# Patient Record
Sex: Female | Born: 1990 | Race: Black or African American | Hispanic: No | Marital: Single | Smoking: Never smoker
Health system: Southern US, Community
[De-identification: ages and names within clinical notes are randomized; demographics above are authoritative.]

## PROBLEM LIST (undated history)

## (undated) DIAGNOSIS — G40909 Epilepsy, unspecified, not intractable, without status epilepticus: Secondary | ICD-10-CM

## (undated) DIAGNOSIS — F329 Major depressive disorder, single episode, unspecified: Secondary | ICD-10-CM

## (undated) DIAGNOSIS — G43909 Migraine, unspecified, not intractable, without status migrainosus: Secondary | ICD-10-CM

## (undated) DIAGNOSIS — F32A Depression, unspecified: Secondary | ICD-10-CM

## (undated) DIAGNOSIS — F419 Anxiety disorder, unspecified: Secondary | ICD-10-CM

## (undated) HISTORY — DX: Migraine, unspecified, not intractable, without status migrainosus: G43.909

## (undated) HISTORY — PX: WISDOM TOOTH EXTRACTION: SHX21

---
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
## 2011-07-14 ENCOUNTER — Inpatient Hospital Stay (INDEPENDENT_AMBULATORY_CARE_PROVIDER_SITE_OTHER)
Admission: RE | Admit: 2011-07-14 | Discharge: 2011-07-14 | Disposition: A | Discharge status: Home / Self Care | Payer: PRIVATE HEALTH INSURANCE | Source: Ambulatory Visit | Attending: Family Medicine | Admitting: Family Medicine

## 2011-07-14 ENCOUNTER — Emergency Department (HOSPITAL_COMMUNITY)
Admission: EM | Admit: 2011-07-14 | Discharge: 2011-07-15 | Disposition: A | Discharge status: Home / Self Care | Payer: PRIVATE HEALTH INSURANCE | Attending: Emergency Medicine | Admitting: Emergency Medicine

## 2011-07-14 DIAGNOSIS — M6281 Muscle weakness (generalized): Secondary | ICD-10-CM

## 2011-07-14 DIAGNOSIS — M79609 Pain in unspecified limb: Secondary | ICD-10-CM | POA: Insufficient documentation

## 2011-07-14 DIAGNOSIS — Z79899 Other long term (current) drug therapy: Secondary | ICD-10-CM | POA: Insufficient documentation

## 2011-07-14 DIAGNOSIS — G40909 Epilepsy, unspecified, not intractable, without status epilepticus: Secondary | ICD-10-CM | POA: Insufficient documentation

## 2011-07-14 DIAGNOSIS — M549 Dorsalgia, unspecified: Secondary | ICD-10-CM | POA: Insufficient documentation

## 2011-07-14 DIAGNOSIS — R52 Pain, unspecified: Secondary | ICD-10-CM | POA: Insufficient documentation

## 2011-07-14 DIAGNOSIS — F39 Unspecified mood [affective] disorder: Secondary | ICD-10-CM

## 2011-07-14 LAB — POCT I-STAT, CHEM 8
BUN: 7 mg/dL (ref 6–23)
Calcium, Ion: 1.16 mmol/L (ref 1.12–1.32)
Chloride: 101 mEq/L (ref 96–112)
Creatinine, Ser: 1 mg/dL (ref 0.50–1.10)
Glucose, Bld: 89 mg/dL (ref 70–99)
HCT: 39 % (ref 36.0–46.0)
Hemoglobin: 13.3 g/dL (ref 12.0–15.0)
TCO2: 20 mmol/L (ref 0–100)

## 2011-11-17 ENCOUNTER — Emergency Department (HOSPITAL_COMMUNITY)
Admission: EM | Admit: 2011-11-17 | Discharge: 2011-11-17 | Disposition: A | Discharge status: Home / Self Care | Payer: PRIVATE HEALTH INSURANCE | Attending: Emergency Medicine | Admitting: Emergency Medicine

## 2011-11-17 ENCOUNTER — Encounter: Payer: Self-pay | Admitting: *Deleted

## 2011-11-17 DIAGNOSIS — Z79899 Other long term (current) drug therapy: Secondary | ICD-10-CM | POA: Insufficient documentation

## 2011-11-17 DIAGNOSIS — F3289 Other specified depressive episodes: Secondary | ICD-10-CM | POA: Insufficient documentation

## 2011-11-17 DIAGNOSIS — F329 Major depressive disorder, single episode, unspecified: Secondary | ICD-10-CM | POA: Insufficient documentation

## 2011-11-17 DIAGNOSIS — S0100XA Unspecified open wound of scalp, initial encounter: Secondary | ICD-10-CM | POA: Insufficient documentation

## 2011-11-17 DIAGNOSIS — IMO0002 Reserved for concepts with insufficient information to code with codable children: Secondary | ICD-10-CM | POA: Insufficient documentation

## 2011-11-17 DIAGNOSIS — S0101XA Laceration without foreign body of scalp, initial encounter: Secondary | ICD-10-CM

## 2011-11-17 DIAGNOSIS — R51 Headache: Secondary | ICD-10-CM | POA: Insufficient documentation

## 2011-11-17 HISTORY — DX: Depression, unspecified: F32.A

## 2011-11-17 HISTORY — DX: Major depressive disorder, single episode, unspecified: F32.9

## 2011-11-17 MED ORDER — HYDROCODONE-ACETAMINOPHEN 5-325 MG PO TABS
1.0000 | ORAL_TABLET | Freq: Once | ORAL | Status: AC
  Administered 2011-11-17: 1 via ORAL
  Filled 2011-11-17: qty 1

## 2011-11-17 MED ORDER — TETANUS-DIPHTH-ACELL PERTUSSIS 5-2.5-18.5 LF-MCG/0.5 IM SUSP
0.5000 mL | Freq: Once | INTRAMUSCULAR | Status: AC
  Administered 2011-11-17: 0.5 mL via INTRAMUSCULAR
  Filled 2011-11-17 (×2): qty 0.5

## 2011-11-17 MED ORDER — HYDROCODONE-ACETAMINOPHEN 5-325 MG PO TABS: 1.0000 | ORAL_TABLET | Freq: Four times a day (QID) | ORAL | Status: AC | PRN

## 2011-11-17 NOTE — ED Provider Notes (Signed)
History     CSN: [id number]  Arrival date & time 11/17/11  1715   First MD Initiated Contact with Patient 11/17/11 2024      Chief Complaint  Patient presents with  . Head Laceration    (Consider location/radiation/quality/duration/timing/severity/associated sxs/prior treatment) HPI Comments: Patient presents to the emergency department with a chief complaint of scalp laceration.  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  Her head with contact to the automatic can try her.  Patient does not know when her last tetanus shot was.  Patient denies headaches, change in vision, nausea, vomiting.  Patient is a 21 y.o. female presenting with scalp laceration. The history is provided by the patient.  Head Laceration This is a new problem. The current episode started today. Pertinent negatives include no abdominal pain, anorexia, arthralgias, change in bowel habit, chest pain, chills, congestion, coughing, diaphoresis, fatigue, fever, headaches, joint swelling, myalgias, nausea, neck pain, numbness, rash, sore throat, urinary symptoms, vertigo, visual change, vomiting or weakness. The symptoms are aggravated by nothing. She has tried nothing for the symptoms.    Past Medical History  Diagnosis Date  . Depression     Past Surgical History  Procedure Date  . Wisdom tooth extraction     Family History  Problem Relation Age of Onset  . Diabetes Mother   . Hypertension Father     History  Substance Use Topics  . Smoking status: Never Smoker   . Smokeless tobacco: Never Used  . Alcohol Use: No    OB History    Grav Para Term Preterm Abortions TAB SAB Ect Mult Living                  Review of Systems  Constitutional: Negative for fever, chills, diaphoresis and fatigue.  HENT: Negative for congestion, sore throat and neck pain.   Respiratory: Negative for cough.   Cardiovascular: Negative for chest pain.  Gastrointestinal: Negative for nausea, vomiting,  abdominal pain, anorexia and change in bowel habit.  Musculoskeletal: Negative for myalgias, joint swelling and arthralgias.  Skin: Negative for rash.  Neurological: Negative for vertigo, weakness, numbness and headaches.  All other systems reviewed and are negative.    Allergies  Review of patient's allergies indicates no known allergies.  Home Medications   Current Outpatient Rx  Name Route Sig Dispense Refill  . CITALOPRAM HYDROBROMIDE 20 MG PO TABS Oral Take 20 mg by mouth daily.      Marland Kitchen FERROUS SULFATE 325 (65 FE) MG PO TABS Oral Take 325 mg by mouth daily with breakfast.      . POTASSIUM GLUCONATE 2 MEQ PO TABS Oral Take 1 tablet by mouth.        BP 135/61  Pulse 71  Temp(Src) 98.7 F (37.1 C) (Oral)  Resp 18  SpO2 97%  LMP 11/09/2011  Physical Exam  Nursing note and vitals reviewed. Constitutional: She is oriented to person, place, and time. She appears well-developed and well-nourished. No distress.  HENT:  Head: Normocephalic. Head is without raccoon's eyes and without Battle's sign.         4 cm scalp laceration currently bleeding.  Tender to palpation.  Eyes: Conjunctivae and EOM are normal. Pupils are equal, round, and reactive to light.       Normal appearance  Neck: Normal range of motion.  Musculoskeletal: Normal range of motion.  Neurological: She is alert and oriented to person, place, and time. Coordination normal.  Psychiatric: She has  a normal mood and affect. Her behavior is normal.    ED Course  Procedures (including critical care time) LACERATION REPAIR Performed by: Jaci Carrel Authorized by: Jaci Carrel Consent: Verbal consent obtained. Risks and benefits: risks, benefits and alternatives were discussed Consent given by: patient Patient identity confirmed: provided demographic data Prepped and Draped in normal sterile fashion Wound explored  Laceration Location: Scalp laceration  Laceration Length: For cm  No Foreign Bodies seen or  palpated  Anesthesia: local infiltration  Local anesthetic: lidocaine not used   Irrigation method: syringe Amount of cleaning: standard  Skin closure: Staples   Number of staples: Four   Patient tolerance: Patient tolerated the procedure well with no immediate complications.   Labs Reviewed - No data to display No results found.   No diagnosis found.  Patient hemodynamically stable no acute complaints.  She is being discharged with pain medication and instructions to return to the emergency department for staple removal and wound check.  MDM  Scalp laceration        Jaci Carrel, [place] 11/17/11 2118

## 2011-11-17 NOTE — ED Notes (Signed)
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

## 2011-11-18 NOTE — ED Provider Notes (Signed)
Medical screening examination/treatment/procedure(s) were performed by non-physician practitioner and as supervising physician I was immediately available for consultation/collaboration.  Avion Patella P Teyanna Thielman, MD 11/18/11 0025 

## 2012-07-15 ENCOUNTER — Emergency Department (HOSPITAL_COMMUNITY)
Admission: EM | Admit: 2012-07-15 | Discharge: 2012-07-17 | Disposition: A | Discharge status: Other Acute Inpatient Hospital | Payer: No Typology Code available for payment source | Attending: Emergency Medicine | Admitting: Emergency Medicine

## 2012-07-15 ENCOUNTER — Encounter (HOSPITAL_COMMUNITY): Payer: Self-pay | Admitting: Emergency Medicine

## 2012-07-15 DIAGNOSIS — F329 Major depressive disorder, single episode, unspecified: Secondary | ICD-10-CM

## 2012-07-15 DIAGNOSIS — IMO0002 Reserved for concepts with insufficient information to code with codable children: Secondary | ICD-10-CM | POA: Insufficient documentation

## 2012-07-15 DIAGNOSIS — F32A Depression, unspecified: Secondary | ICD-10-CM

## 2012-07-15 DIAGNOSIS — F3289 Other specified depressive episodes: Secondary | ICD-10-CM | POA: Insufficient documentation

## 2012-07-15 DIAGNOSIS — Z79899 Other long term (current) drug therapy: Secondary | ICD-10-CM | POA: Insufficient documentation

## 2012-07-15 DIAGNOSIS — R45851 Suicidal ideations: Secondary | ICD-10-CM | POA: Insufficient documentation

## 2012-07-15 HISTORY — DX: Epilepsy, unspecified, not intractable, without status epilepticus: G40.909

## 2012-07-15 LAB — CBC
HCT: 36.3 % (ref 36.0–46.0)
Hemoglobin: 12.2 g/dL (ref 12.0–15.0)
MCH: 26.2 pg (ref 26.0–34.0)
MCHC: 33.6 g/dL (ref 30.0–36.0)
MCV: 77.9 fL — ABNORMAL LOW (ref 78.0–100.0)
Platelets: 231 10*3/uL (ref 150–400)
RBC: 4.66 MIL/uL (ref 3.87–5.11)
RDW: 13.2 % (ref 11.5–15.5)
WBC: 6.2 10*3/uL (ref 4.0–10.5)

## 2012-07-15 LAB — COMPREHENSIVE METABOLIC PANEL
ALT: 10 U/L (ref 0–35)
AST: 15 U/L (ref 0–37)
Albumin: 4.2 g/dL (ref 3.5–5.2)
Alkaline Phosphatase: 47 U/L (ref 39–117)
BUN: 9 mg/dL (ref 6–23)
CO2: 29 mEq/L (ref 19–32)
Calcium: 9.4 mg/dL (ref 8.4–10.5)
Chloride: 102 mEq/L (ref 96–112)
Creatinine, Ser: 0.81 mg/dL (ref 0.50–1.10)
GFR calc Af Amer: >90 mL/min (ref >90)
GFR calc non Af Amer: >90 mL/min (ref >90)
Glucose, Bld: 106 mg/dL — ABNORMAL HIGH (ref 70–99)
Potassium: 3.6 mEq/L (ref 3.5–5.1)
Sodium: 138 mEq/L (ref 135–145)
Total Bilirubin: 0.9 mg/dL (ref 0.3–1.2)
Total Protein: 7.5 g/dL (ref 6.0–8.3)

## 2012-07-15 LAB — RAPID URINE DRUG SCREEN, HOSP PERFORMED
Amphetamines: NOT DETECTED
Barbiturates: NOT DETECTED
Benzodiazepines: NOT DETECTED
Cocaine: NOT DETECTED
Opiates: NOT DETECTED
Tetrahydrocannabinol: NOT DETECTED

## 2012-07-15 LAB — PREGNANCY, URINE: Preg Test, Ur: NEGATIVE

## 2012-07-15 LAB — ETHANOL: Alcohol, Ethyl (B): <11 mg/dL (ref 0–11)

## 2012-07-15 MED ORDER — CITALOPRAM HYDROBROMIDE 20 MG PO TABS
20.0000 mg | ORAL_TABLET | Freq: Every day | ORAL | Status: DC
  Administered 2012-07-15: 20 mg via ORAL
  Filled 2012-07-15 (×2): qty 1

## 2012-07-15 NOTE — ED Notes (Signed)
Pt given sandwhich and water to drink.

## 2012-07-15 NOTE — ED Provider Notes (Signed)
History     CSN: [id number]  Arrival date & time 07/15/12  1910   First MD Initiated Contact with Patient 07/15/12 2107      Chief Complaint  Patient presents with  . Medical Clearance    (Consider location/radiation/quality/duration/timing/severity/associated sxs/prior treatment) The history is provided by the patient. No language interpreter was used.  cc:  21 year-old female coming in today with suicidal ideation and depression. States that she is in school and working and living with her parents who she does not get along with especially her mother. 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 States that the scar since healed over since then. Today she's been thinking of driving her car off the side of the road. 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 States that she feels that she is about to have a mental rate down. She does go to a therapist and her therapist told her to come to the ER. States that the only trouble she can think that she gets her parents is not calling when she didn't come home. Denies drugs or alcohol she does not smoke. Patient is on Celexa x 1 year 20 mg a day. Patient's friend and aunt at the bedside   Past Medical History  Diagnosis Date  . Depression   . Epilepsy     Past Surgical History  Procedure Date  . Wisdom tooth extraction     Family History  Problem Relation Age of Onset  . Diabetes Mother   . Hypertension Father     History  Substance Use Topics  . Smoking status: Never Smoker   . Smokeless tobacco: Never Used  . Alcohol Use: No    OB History    Grav Para Term Preterm Abortions TAB SAB Ect Mult Living                  Review of Systems  Constitutional: Negative.   HENT: Negative.   Eyes: Negative.   Respiratory: Negative.   Cardiovascular: Negative.   Gastrointestinal: Negative.   Neurological: Negative.     Psychiatric/Behavioral: Positive for agitation.       Depressed /suicidal  All other systems reviewed and are negative.    Allergies  Review of patient's allergies indicates no known allergies.  Home Medications   Current Outpatient Rx  Name Route Sig Dispense Refill  . CITALOPRAM HYDROBROMIDE 20 MG PO TABS Oral Take 20 mg by mouth daily.     Marland Kitchen FERROUS SULFATE 325 (65 FE) MG PO TABS Oral Take 325 mg by mouth daily with breakfast.       BP 120/79  Pulse 89  Temp 98 F (36.7 C)  Resp 20  SpO2 99%  LMP 07/05/2012  Physical Exam  Nursing note and vitals reviewed. Constitutional: She is oriented to person, place, and time. She appears well-developed and well-nourished.  HENT:  Head: Normocephalic and atraumatic.  Eyes: Conjunctivae and EOM are normal. Pupils are equal, round, and reactive to light.  Neck: Normal range of motion. Neck supple.  Cardiovascular: Normal rate.   Pulmonary/Chest: Effort normal.  Abdominal: Soft.  Musculoskeletal: Normal range of motion. She exhibits no edema and no tenderness.  Neurological: She is alert and oriented to person, place, and time. She has normal reflexes. No cranial nerve deficit. Coordination normal.  Skin: Skin is warm and dry.  Psychiatric: Her speech is normal. Judgment normal. Her mood  appears anxious. She is slowed. Cognition and memory are normal. She exhibits a depressed mood. She expresses suicidal ideation. She expresses no homicidal ideation. She expresses suicidal plans. She expresses no homicidal plans.    ED Course  Procedures (including critical care time)  Labs Reviewed  CBC - Abnormal; Notable for the following:    MCV 77.9 (*)     All other components within normal limits  COMPREHENSIVE METABOLIC PANEL - Abnormal; Notable for the following:    Glucose, Bld 106 (*)     All other components within normal limits  ETHANOL  URINE RAPID DRUG SCREEN (HOSP PERFORMED)  PREGNANCY, URINE   No results found.   No  diagnosis found.    MDM  21 year old coming in with depression and suicidal ideations. Lives with her parents and is not getting along with him especially her mother. Wants to drive her car off the side of the road to  End it all. Psyche holding orders in. Med orders in. Sitter at bedside.  For telepsyche evaluation.  On celexa 20mg  daily.  Works at [employer] and goes to [employer].    Labs Reviewed  CBC - Abnormal; Notable for the following:    MCV 77.9 (*)     All other components within normal limits  COMPREHENSIVE METABOLIC PANEL - Abnormal; Notable for the following:    Glucose, Bld 106 (*)     All other components within normal limits  ETHANOL  URINE RAPID DRUG SCREEN (HOSP PERFORMED)  PREGNANCY, URINE          Remi Haggard, NP 07/16/12 0120

## 2012-07-15 NOTE — ED Notes (Signed)
MD at bedside. 

## 2012-07-15 NOTE — ED Notes (Signed)
Pt states she has depression and has been seeing a therapist for that  Pt states she had an appt with her today and it went well   Pt states after her appt she talked with her grandmother that was giving her a hard time about school and her friends  Pt states she has been having issues with school, her family, etc and today it just became too much  Pt states she thought about getting in her car and just driving and whatever happened would happen  Pt states today is the first time in a long time she has had suicidal thoughts, no plan and states she thinks about hurting others almost daily   Visitor in room with pt very supportive of pt and her situation   Pt belongings taken to the car by her aunt.

## 2012-07-16 MED ORDER — ACETAMINOPHEN 325 MG PO TABS
650.0000 mg | ORAL_TABLET | Freq: Four times a day (QID) | ORAL | Status: DC | PRN
  Administered 2012-07-16: 650 mg via ORAL
  Filled 2012-07-16: qty 2

## 2012-07-16 MED ORDER — CITALOPRAM HYDROBROMIDE 20 MG PO TABS
30.0000 mg | ORAL_TABLET | Freq: Every day | ORAL | Status: DC
  Administered 2012-07-16 – 2012-07-17 (×2): 30 mg via ORAL
  Filled 2012-07-16 (×2): qty 1

## 2012-07-16 NOTE — BHH Counselor (Signed)
Per Venda Rodes at [facility] - pt has been accepted by Donell Sievert PA pending 500 hall bed.

## 2012-07-16 NOTE — ED Notes (Signed)
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
## 2012-07-16 NOTE — ED Provider Notes (Signed)
No issues overnight. Medically stable. Awake and no issues. Pending placement in behavorial health.   Richardean Canal, MD 07/16/12 [phone]

## 2012-07-16 NOTE — ED Provider Notes (Signed)
Psychiatry consult completed. Recommending inpatient treatment. Celexa increased per recommendation.   Raeford Razor, MD 07/16/12 [phone]

## 2012-07-16 NOTE — BHH Counselor (Signed)
Info sent to the following locations today:   [facility] - Pending review  [facility] - Pending reveiw [facility] - no bed availability  [facility] - no bed availability  [facility] - No bed availability.

## 2012-07-17 ENCOUNTER — Encounter (HOSPITAL_COMMUNITY): Payer: Self-pay

## 2012-07-17 ENCOUNTER — Inpatient Hospital Stay (HOSPITAL_COMMUNITY)
Admission: AD | Admit: 2012-07-17 | Discharge: 2012-07-21 | DRG: 885 | Disposition: A | Discharge status: Home / Self Care | Payer: No Typology Code available for payment source | Source: Ambulatory Visit | Attending: Psychiatry | Admitting: Psychiatry

## 2012-07-17 DIAGNOSIS — R45851 Suicidal ideations: Secondary | ICD-10-CM

## 2012-07-17 DIAGNOSIS — R45850 Homicidal ideations: Secondary | ICD-10-CM

## 2012-07-17 DIAGNOSIS — G40909 Epilepsy, unspecified, not intractable, without status epilepticus: Secondary | ICD-10-CM | POA: Diagnosis present

## 2012-07-17 DIAGNOSIS — F332 Major depressive disorder, recurrent severe without psychotic features: Secondary | ICD-10-CM

## 2012-07-17 DIAGNOSIS — F323 Major depressive disorder, single episode, severe with psychotic features: Secondary | ICD-10-CM

## 2012-07-17 DIAGNOSIS — F603 Borderline personality disorder: Secondary | ICD-10-CM | POA: Diagnosis present

## 2012-07-17 MED ORDER — CITALOPRAM HYDROBROMIDE 20 MG PO TABS
20.0000 mg | ORAL_TABLET | Freq: Every day | ORAL | Status: DC
  Filled 2012-07-17: qty 1

## 2012-07-17 MED ORDER — CITALOPRAM HYDROBROMIDE 20 MG PO TABS
30.0000 mg | ORAL_TABLET | Freq: Every day | ORAL | Status: DC
  Administered 2012-07-18: 30 mg via ORAL
  Filled 2012-07-17 (×2): qty 1

## 2012-07-17 MED ORDER — MAGNESIUM HYDROXIDE 400 MG/5ML PO SUSP
30.0000 mL | Freq: Every day | ORAL | Status: DC | PRN
  Administered 2012-07-18: 30 mL via ORAL

## 2012-07-17 MED ORDER — ACETAMINOPHEN 325 MG PO TABS: 650.0000 mg | ORAL_TABLET | Freq: Four times a day (QID) | ORAL | Status: DC | PRN

## 2012-07-17 MED ORDER — ALUM & MAG HYDROXIDE-SIMETH 200-200-20 MG/5ML PO SUSP: 30.0000 mL | ORAL | Status: DC | PRN

## 2012-07-17 MED ORDER — HYDROXYZINE HCL 25 MG PO TABS: 25.0000 mg | ORAL_TABLET | Freq: Every evening | ORAL | Status: DC | PRN
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
## 2012-07-17 NOTE — ED Provider Notes (Signed)
Patient was admitting for suicidal ideation and depression. She's had a callus-like consult but recommends admission. Patient states she feels better and does not feel suicidal anymore. However she states she does not feel ready to go home.  Devoria Albe, MD, FACEP   Ward Givens, MD 07/17/12 [phone]

## 2012-07-17 NOTE — ED Provider Notes (Signed)
Medical screening examination/treatment/procedure(s) were performed by non-physician practitioner and as supervising physician I was immediately available for consultation/collaboration.  Raeford Razor, MD 07/17/12 [phone]

## 2012-07-17 NOTE — Progress Notes (Signed)
Voluntary admission for a 21 y.o. Female with flat affect, depressed mood.  Pt. Reports that she became overwhelmed Tuesday because of school, work, car payments and a strained relationship that she has with her mother.  Pt. Reports that she had thoughts of driving her car into a building but just sat in her car.  Pt. Denies SI/HI and denies A/V hallucinations and contracts for safety.  Pt. Denies substance abuse.  Pt. Reports that she wants to work on " how to deal with stressful things better."  Pt. Oriented to unit.  Support given.

## 2012-07-17 NOTE — ED Notes (Signed)
Patient discharge to [facility] via ambulatory with steady gait. Respirations equal and unlabored. Skin warm and dry. No acute distress noted.

## 2012-07-17 NOTE — ED Notes (Signed)
Pt up to nurses station states she is bored and wants to go home. Pt updated on plan/MD notes

## 2012-07-17 NOTE — ED Notes (Signed)
Spoke with admitting department at [facility]

## 2012-07-17 NOTE — ED Notes (Signed)
MD at bedside. 

## 2012-07-17 NOTE — Tx Team (Signed)
Initial Interdisciplinary Treatment Plan  PATIENT STRENGTHS: (choose at least two) Ability for insight Average or above average intelligence Religious Affiliation  PATIENT STRESSORS: Financial difficulties Marital or family conflict   PROBLEM LIST: Problem List/Patient Goals Date to be addressed Date deferred Reason deferred Estimated date of resolution  Depression 07/17/12                                                      DISCHARGE CRITERIA:  Improved stabilization in mood, thinking, and/or behavior Motivation to continue treatment in a less acute level of care Need for constant or close observation no longer present Verbal commitment to aftercare and medication compliance  PRELIMINARY DISCHARGE PLAN: Return to previous living arrangement Return to previous work or school arrangements  PATIENT/FAMIILY INVOLVEMENT: This treatment plan has been presented to and reviewed with the patient, Brianna Cordova, and/or family member, The patient and family have been given the opportunity to ask questions and make suggestions.  Sherald Balbuena Dawkins 07/17/2012, 8:57 PM

## 2012-07-18 ENCOUNTER — Telehealth (HOSPITAL_COMMUNITY): Payer: Self-pay | Admitting: Licensed Clinical Social Worker

## 2012-07-18 DIAGNOSIS — F603 Borderline personality disorder: Secondary | ICD-10-CM | POA: Diagnosis present

## 2012-07-18 DIAGNOSIS — F323 Major depressive disorder, single episode, severe with psychotic features: Secondary | ICD-10-CM

## 2012-07-18 MED ORDER — CITALOPRAM HYDROBROMIDE 10 MG PO TABS
30.0000 mg | ORAL_TABLET | Freq: Every day | ORAL | Status: DC
  Administered 2012-07-19 – 2012-07-21 (×3): 30 mg via ORAL
  Filled 2012-07-18 (×4): qty 3
  Filled 2012-07-18: qty 42
  Filled 2012-07-18: qty 3

## 2012-07-18 MED ORDER — RISPERIDONE 0.5 MG PO TABS
0.5000 mg | ORAL_TABLET | Freq: Every day | ORAL | Status: DC
  Administered 2012-07-18 – 2012-07-20 (×3): 0.5 mg via ORAL
  Filled 2012-07-18 (×7): qty 1

## 2012-07-18 MED ORDER — RISPERIDONE 0.25 MG PO TABS
0.2500 mg | ORAL_TABLET | Freq: Two times a day (BID) | ORAL | Status: DC
  Administered 2012-07-18 – 2012-07-21 (×6): 0.25 mg via ORAL
  Filled 2012-07-18: qty 1
  Filled 2012-07-18: qty 56
  Filled 2012-07-18 (×7): qty 1
  Filled 2012-07-18: qty 56
  Filled 2012-07-18 (×4): qty 1

## 2012-07-18 NOTE — Progress Notes (Signed)
D) Patient quiet, forwarding little upon my assessment. Patient states slept "fair", appetite is " improving" today. Patient rates depression as 9/10 and hopeless feelings as 9/10. Patient endorses passive SI, contracts verbally for safety with RN. Patient denies HI, denies A/V hallucinations at this time.   A) Patient offered support and encouragement. Patient seen by treatment team and MD to evaluate care plan and medications. Patient verbalized understanding of care plan. Patient remains safe on unit with Q15 minute checks for safety.   R) Patient verbalizes understanding of care plan. Patient minimally engaging with peers on unit. Patient attending groups in day room and meals in dining room. Patient has a plan to "take meds, deal with stress better, and try to become closer to the people around me." Patient verbalizes no questions/concerns at this time. Will continue to monitor.
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
## 2012-07-18 NOTE — Progress Notes (Signed)
Pt participated in therapeutic activity where they watched the pursuit of happiness. They will finish the movie later in the afternoon and have time to process what they watched.   Brianna Cordova MHT 

## 2012-07-18 NOTE — [facility] Suicide Risk Assessment (Signed)
Suicide Risk Assessment  Admission Assessment     Nursing information obtained from:  Patient Demographic factors:  Adolescent or young adult Current Mental Status:   (denies) Loss Factors:  Loss of significant relationship;Financial problems / change in socioeconomic status Historical Factors:    Risk Reduction Factors:  Religious beliefs about death;Employed;Living with another person, especially a relative;Positive social support  CLINICAL FACTORS:   Severe Anxiety and/or Agitation Personality Disorders:   Cluster B  COGNITIVE FEATURES THAT CONTRIBUTE TO RISK:  Thought constriction (tunnel vision)    SUICIDE RISK:   Moderate:  Frequent suicidal ideation with limited intensity, and duration, some specificity in terms of plans, no associated intent, good self-control, limited dysphoria/symptomatology, some risk factors present, and identifiable protective factors, including available and accessible social support.  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 Depression   . Epilepsy    Axis IV: other psychosocial or environmental problems Axis V: 31-40 impairment in reality testing  ADL's:  Intact  Sleep: Poor  Appetite:  Poor  Suicidal Ideation:  Pt comes to hospital with serious and persistent thoughts of making herself dead. Homicidal Ideation:  Pt comes to the hsopital with serious and persistent thoughts of making others who cross her a being dead.  She thinks of ways to torture and kill people.  AEB (as evidenced by): per pt report  Mental Status Examination/Evaluation: Objective:  Appearance: Casual  Eye Contact::  Good  Speech:  Clear and Coherent  Volume:  Normal  Mood:  Anxious, Depressed, Hopeless, Irritable and Worthless  Affect:  Congruent  Thought Process:  Coherent    Orientation:  Full  Thought Content:  Hallucinations: Auditory Visual  Suicidal Thoughts:  Yes.  without intent/plan  Homicidal Thoughts:  Yes.  without intent/plan  Memory:  Immediate;   Fair Recent;   Fair Remote;   Fair  Judgement:  Impaired  Insight:  Lacking  Psychomotor Activity:  Normal  Concentration:  Fair  Recall:  Fair  Akathisia:  No  Handed:  Right  AIMS (if indicated):     Assets:  Communication Skills Desire for Improvement  Sleep:  Number of Hours: 6.5    Vital Signs:Blood pressure 101/66, pulse 102, temperature 98.3 F (36.8 C), temperature source Oral, resp. rate 16, height 5\' 2"  (1.575 m), weight 64.411 kg (142 lb), last menstrual period 07/05/2012. Current Medications: Current Facility-Administered Medications  Medication Dose Route Frequency Provider Last Rate Last Dose  . acetaminophen (TYLENOL) tablet 650 mg  650 mg Oral Q6H PRN Mike Craze, MD      . alum & mag hydroxide-simeth (MAALOX/MYLANTA) 200-200-20 MG/5ML suspension 30 mL  30 mL Oral Q4H PRN Mike Craze, MD      . citalopram (CELEXA) tablet 30 mg  30 mg Oral Daily Mike Craze, MD      . hydrOXYzine (ATARAX/VISTARIL) tablet 25 mg  25 mg Oral QHS PRN,MR X 1 Mike Craze, MD      . magnesium hydroxide (MILK OF MAGNESIA) suspension 30 mL  30 mL Oral Daily PRN Mike Craze, MD   30 mL at 07/18/12 1522  . risperiDONE (RISPERDAL) tablet 0.25 mg  0.25 mg Oral BID Mike Craze, MD      . risperiDONE (RISPERDAL) tablet 0.5 mg  0.5 mg Oral QHS Mike Craze, MD      .  DISCONTD: citalopram (CELEXA) tablet 20 mg  20 mg Oral Daily Mike Craze, MD      . DISCONTD: citalopram (CELEXA) tablet 30 mg  30 mg Oral Daily Mike Craze, MD   30 mg at 07/18/12 1610   Facility-Administered Medications Ordered in Other Encounters  Medication Dose Route Frequency Provider Last Rate Last Dose  . DISCONTD: acetaminophen (TYLENOL) tablet 650 mg  650 mg Oral Q6H PRN Richardean Canal, MD   650 mg at 07/16/12 1035   . DISCONTD: citalopram (CELEXA) tablet 30 mg  30 mg Oral Daily Raeford Razor, MD   30 mg at 07/17/12 1021    Lab Results: No results found for this or any previous visit (from the past 48 hour(s)).  Physical Findings: AIMS:  , ,  ,  ,    CIWA:    COWS:     Risk: Risk of harm to self is elevated by her serious and persistent suicidal thoughts  Risk of harm to others is elevated by her seruious  Treatment Plan Summary: Daily contact with patient to assess and evaluate symptoms and progress in treatment Medication management No suicidal or homicidal thoughts for at least 48 hours. Mood/anxiety less than 3/10 where the scale is 1 is the best and 10 is the worst  Plan: Admit, start Risperdal continue Celexa, but consider higher dose soon, plan to refer to DBT for group follow up .  Discussed the risks, benefits, and probable clinical course with and without treatment.  Pt is agreeable to the current course of treatment. We will continue on q. 15 checks the unit protocol. At this time there is no clinical indication for one-to-one observation as patient contract for safety and presents little risk to harm themself and others.  We will increase collateral information. I encourage patient to participate in group milieu therapy. Pt will be seen in treatment team soon for further treatment and appropriate discharge planning. Please see history and physical note for more detailed information ELOS: 3 to 5 days.   Vignesh Willert 07/18/2012, 5:14 PM

## 2012-07-18 NOTE — Tx Team (Addendum)
Interdisciplinary Treatment Plan Update (Adult)  Date:  07/18/2012  Time Reviewed:  10:12 AM   Progress in Treatment: Attending groups: Yes Participating in groups:  Yes Taking medication as prescribed:  Yes Tolerating medication: Yes Family/Significant othe contact made: Requesting consent to contact support Patient understands diagnosis: Yes Discussing patient identified problems/goals with staff:  Yes Medical problems stabilized or resolved: Yes Denies suicidal/homicidal ideation: No, reports thoughts of torturing and harming others who have bullied her Issues/concerns per patient self-inventory:  No  Other:  New problem(s) identified: None  Reason for Continuation of Hospitalization: Depression Medication Stabilization Homicidal Ideation  Interventions implemented related to continuation of hospitalization:  Medication stabilization, safety checks q 15 mins, group attendance  Additional comments:  Estimated length of stay: 4-6 days  Discharge Plan: Brianna Cordova will discharge to live with aunt, and will follow up with Wallie Char for therapy; case manager will set up follow up with psychiatrist in [facility] goal(s):  Review of initial/current patient goals per problem list:   1.  Goal(s): Decrease depressive symptoms to rating of 4 or less  Met:  No  Target date: by discharge  As evidenced by: Brianna Cordova rates depression at 10   2.  Goal (s): Reduce potential for suicide/self-harm  Met:  Yes  Target date: by discharge  As evidenced by: Brianna Cordova reports no suicidal thoughts today  3.  Goal(s): Reduce potential for harm to others/homicide  Met:  No  Target date: by discharge  As evidenced by: Brianna Cordova reports she has thoughts about torturing and killing mother and people who bullied her as a child  4.  Goal(s): Medication stabilization  Met:  No  Target date: by discharge  As evidenced by: Brianna Cordova has not yet had her medication evaluated; new meds to be  started today  Attendees: Patient: Brianna Cordova  07/18/2012 10:12 AM  Family:     Physician:  Dr Orson Aloe, MD 07/18/2012 10:12 AM  Nursing:   Nestor Ramp, RN 07/18/2012 10:12 AM  Case Manager:  Juline Patch, LCSW 07/18/2012 10:12 AM  Counselor:  Angus Palms, LCSW 07/18/2012 10:12 AM  Other:  Reyes Ivan, LCSWA 07/18/2012 10:12 AM  Other:  Berneice Heinrich, RN 07/18/2012 10:12 AM  Other:  Walker Shadow, JMSW Intern 07/18/2012 10:12 AM  Other:      Scribe for Treatment Team:   Billie Lade, 07/18/2012 10:12 AM

## 2012-07-18 NOTE — Progress Notes (Signed)
Psychoeducational Group Note  Date:  07/18/2012 Time:  11:59  Group Topic/Focus:  Relapse Prevention Planning:   The focus of this group is to define relapse and discuss the need for planning to combat relapse.  Participation Level:  None  Participation Quality:  Appropriate  Affect:  Flat  Cognitive:  Appropriate  Insight:  None  Engagement in Group:  None  Additional Comments:  Did not want to share or answer any questions during group  Alvie Fowles Jvette 07/18/2012, 11:59 AM

## 2012-07-18 NOTE — Progress Notes (Signed)
[facility] Group Notes:  (Counselor/Nursing/MHT/Case Management/Adjunct)  07/18/2012 11:25 PM  Type of Therapy:  Psychoeducational Skills  Participation Level:  Active  Participation Quality:  Attentive  Affect:  Blunted  Cognitive:  Appropriate  Insight:  Good  Engagement in Group:  Limited  Engagement in Therapy:  Limited  Modes of Intervention:  Education  Summary of Progress/Problems: The patient states that she had a good day overall. She states that she feels better due to less anxiety.  Her goal for tomorrow is to learn "how to deal with stress and depression".  She also stated that her day was improved by meeting more of her peers as compared with yesterday.    Brianna Cordova 07/18/2012, 11:25 PM
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
## 2012-07-18 NOTE — H&P (Signed)
Pt has major cluster B traits including borderline and antisocial traits Medical/psychiatric screening examination/treatment/procedure(s) were performed by non-physician practitioner and as supervising physician I was immediately available for consultation/collaboration.  I have seen and examined this patient and agree the major elements of this evaluation.

## 2012-07-18 NOTE — H&P (Signed)
Psychiatric Admission Assessment Adult  Patient Identification:  Brianna Cordova  Date of Evaluation:  07/18/2012  Chief Complaint:  MDD, Severe  History of Present Illness: This is a 21 year old African-American female, admitted to [facility] from the [facility] with complaints of suicidal/homicidal thoughts. Patient reports, "My aunt took me to the hospital last Tuesday. I was thinking about committing suicide by getting into my car and drive into something and then die from the injuries that I will sustain. I was just finishing my counseling session with my therapist, [name]. As I was leaving from meeting with her, I got into my car and had this thought of starting my car and drove it till I wreck it on something. But I did not do just that, rather, I got out of my car, went back to my therapist and told her what I was thinking. She insisted that I have to come to the hospital. I have been depressed since my elementary school. I had no friends. A lot of kids called me names. They teased and bullied me. But I did not have anyone to confide in then. I am an only child. I don't get along with my mother. I do get along with my father, he is always working and when he gets home, he will be too tired to do anything else. My mother does not let it easy on me. She is constantly nagging me, if it was not doing the dishes, then it will be living my light on while I was out of my room. There is no getting a break from her mouth. She runs her mouth all the time. I started taking depression medicine for about 1 year now. I feel like it is not working. A lot of people notices my mood. They tell me that my mood is always flipping on me. I have a lot of stressors in my life. 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 A lot of times, I will get so upset about how I was bullied and teased in my younger years that I thought about finding these people and hurt  them. I don't sleep at night. I hear voices and other people keep telling me that my mood is not right".  ROS: Alert and oriented x 4 Negative for fever.  HENT: Negative for congestion and rhinorrhea.  Respiratory: Negative for cough, chest tightness and shortness of breath.  Cardiovascular: Negative for chest pain.  Gastrointestinal: Negative for nausea, vomiting and abdominal pain.  Skin: Negative for rash.  Neurological: Negative for weakness and headaches   Mood Symptoms:  Anhedonia, Hopelessness, Mood Swings, Past 2 Weeks, Sadness, SI,  Depression Symptoms:  depressed mood, anhedonia, suicidal thoughts with specific plan,  (Hypo) Manic Symptoms:  Irritable Mood,  Anxiety Symptoms:  Excessive Worry,  Psychotic Symptoms:  Hallucinations: Auditory  PTSD Symptoms: Had a traumatic exposure:  Denies any traumatic events.  Past Psychiatric History: Diagnosis: Major depressive disorder, with psychotic features.  Hospitalizations: [facility]  Outpatient Care: "I have a therapist called, paula Powell"  Substance Abuse Care: None reported  Self-Mutilation: Denies self mutilation  Suicidal Attempts: Denies attempts, admits thoughts.  Violent Behaviors: None reported   Past Medical History:   Past Medical History  Diagnosis Date  . Depression   . Epilepsy      Allergies:  No Known Allergies  PTA Medications: Prescriptions prior to admission  Medication Sig  Dispense Refill  . citalopram (CELEXA) 20 MG tablet Take 20 mg by mouth daily.       . ferrous sulfate 325 (65 FE) MG tablet Take 325 mg by mouth daily with breakfast.          Substance Abuse History in the last 12 months: Substance Age of 1st Use Last Use Amount Specific Type  Nicotine Denies any cigarette smoking, alcohol drinking and or drug use"     Alcohol      Cannabis      Opiates      Cocaine      Methamphetamines      LSD      Ecstasy      Benzodiazepines      Caffeine      Inhalants      Others:                          Consequences of Substance Abuse: Medical Consequences:  Liver damge Legal Consequences:  Arrests, jail time Family Consequences:  Family discord  Social History: Current Place of Residence: [place]   Place of Birth: [place], [place]    Family Members: "My parents"  Marital Status:  Single  Children: 0  Sons: 0  Daughters: 0  Relationships: Single  Education:  Regulatory affairs officer Problems/Performance: "School is stressful"  Religious Beliefs/Practices: None reported  History of Abuse (Emotional/Phsycial/Sexual): Denies any form of abuse  Occupational Experiences: Employed, Consulting civil engineer.  Military History:  None.  Legal History: None reported  Hobbies/Interests: None reported  Family History:   Family History  Problem Relation Age of Onset  . Diabetes Mother   . Hypertension Father     Mental Status Examination/Evaluation: Objective:  Appearance: Disheveled  Eye Contact::  Good  Speech:  Clear and Coherent  Volume:  Normal  Mood:  Depressed  Affect:  Flat and Tearful  Thought Process:  Coherent and Intact  Orientation:  Full  Thought Content:  Hallucinations: Auditory  Suicidal Thoughts:  No, not this morning.  Homicidal Thoughts:  No, not this morning  Memory:  Immediate;   Good Recent;   Good Remote;   Good  Judgement:  Fair  Insight:  Fair  Psychomotor Activity:  Normal  Concentration:  Good  Recall:  Good  Akathisia:  No  Handed:  Right  AIMS (if indicated):     Assets:  Desire for Improvement  Sleep:  Number of Hours: 6.5     Laboratory/X-Ray: None Psychological Evaluation(s)      Assessment:    AXIS I:  Major depressive disorder with psychotic features. AXIS II:  Deferred AXIS III:   Past Medical History  Diagnosis Date  . Depression   . Epilepsy    AXIS IV:  economic problems, occupational problems and other psychosocial or environmental problems AXIS V:  21-30 behavior considerably influenced by  delusions or hallucinations OR serious impairment in judgment, communication OR inability to function in almost all areas  Treatment Plan/Recommendations: Admit for safety and stabilization. Review and reinstate any pertinent home medications for other medical issues. Initiate Abilify 5 mg Q bedtime for auditory hallucinations. Group counseling sessions and activities  Treatment Plan Summary: Daily contact with patient to assess and evaluate symptoms and progress in treatment Medication management  Current Medications:  Current Facility-Administered Medications  Medication Dose Route Frequency Provider Last Rate Last Dose  . acetaminophen (TYLENOL) tablet 650 mg  650 mg Oral Q6H PRN Mike Craze, MD      .  alum & mag hydroxide-simeth (MAALOX/MYLANTA) 200-200-20 MG/5ML suspension 30 mL  30 mL Oral Q4H PRN Mike Craze, MD      . citalopram (CELEXA) tablet 30 mg  30 mg Oral Daily Mike Craze, MD      . hydrOXYzine (ATARAX/VISTARIL) tablet 25 mg  25 mg Oral QHS PRN,MR X 1 Mike Craze, MD      . magnesium hydroxide (MILK OF MAGNESIA) suspension 30 mL  30 mL Oral Daily PRN Mike Craze, MD      . DISCONTD: citalopram (CELEXA) tablet 20 mg  20 mg Oral Daily Mike Craze, MD      . DISCONTD: citalopram (CELEXA) tablet 30 mg  30 mg Oral Daily Mike Craze, MD   30 mg at 07/18/12 1610   Facility-Administered Medications Ordered in Other Encounters  Medication Dose Route Frequency Provider Last Rate Last Dose  . DISCONTD: acetaminophen (TYLENOL) tablet 650 mg  650 mg Oral Q6H PRN Richardean Canal, MD   650 mg at 07/16/12 1035  . DISCONTD: citalopram (CELEXA) tablet 30 mg  30 mg Oral Daily Raeford Razor, MD   30 mg at 07/17/12 1021    Observation Level/Precautions:  Q 15 minute checks for safety  Laboratory:  Reviewed ED lad findings on file.  Psychotherapy:  Group sessions  Medications:  See medication lists  Routine PRN Medications:  Yes  Consultations: None indicated at this time    Discharge Concerns:  Safety for self and others.  Other:     Armandina Stammer I 9/6/20132:20 PM

## 2012-07-18 NOTE — Progress Notes (Signed)
[facility] MD Progress Note  07/18/2012 5:08 PM  Diagnosis:   Axis I: Major Depression, Recurrent severe Axis II: Cluster B Traits Axis III:  Past Medical History  Diagnosis Date  . Depression   . Epilepsy    Axis IV: other psychosocial or environmental problems Axis V: 31-40 impairment in reality testing  ADL's:  Intact  Sleep: Poor  Appetite:  Poor  Suicidal Ideation:  Pt comes to hospital with serious and persistent thoughts of making herself dead. Homicidal Ideation:  Pt comes to the hsopital with serious and persistent thoughts of making others who cross her a being dead.  She thinks of ways to torture and kill people.  AEB (as evidenced by): per pt report  Mental Status Examination/Evaluation: Objective:  Appearance: Casual  Eye Contact::  Good  Speech:  Clear and Coherent  Volume:  Normal  Mood:  Anxious, Depressed, Hopeless, Irritable and Worthless  Affect:  Congruent  Thought Process:  Coherent  Orientation:  Full  Thought Content:  Hallucinations: Auditory Visual  Suicidal Thoughts:  Yes.  without intent/plan  Homicidal Thoughts:  Yes.  without intent/plan  Memory:  Immediate;   Fair Recent;   Fair Remote;   Fair  Judgement:  Impaired  Insight:  Lacking  Psychomotor Activity:  Normal  Concentration:  Fair  Recall:  Fair  Akathisia:  No  Handed:  Right  AIMS (if indicated):     Assets:  Communication Skills Desire for Improvement  Sleep:  Number of Hours: 6.5    Vital Signs:Blood pressure 101/66, pulse 102, temperature 98.3 F (36.8 C), temperature source Oral, resp. rate 16, height 5\' 2"  (1.575 m), weight 64.411 kg (142 lb), last menstrual period 07/05/2012. Current Medications: Current Facility-Administered Medications  Medication Dose Route Frequency Provider Last Rate Last Dose  . acetaminophen (TYLENOL) tablet 650 mg  650 mg Oral Q6H PRN Mike Craze, MD      . alum & mag hydroxide-simeth (MAALOX/MYLANTA) 200-200-20 MG/5ML suspension 30 mL  30 mL  Oral Q4H PRN Mike Craze, MD      . citalopram (CELEXA) tablet 30 mg  30 mg Oral Daily Mike Craze, MD      . hydrOXYzine (ATARAX/VISTARIL) tablet 25 mg  25 mg Oral QHS PRN,MR X 1 Mike Craze, MD      . magnesium hydroxide (MILK OF MAGNESIA) suspension 30 mL  30 mL Oral Daily PRN Mike Craze, MD   30 mL at 07/18/12 1522  . risperiDONE (RISPERDAL) tablet 0.25 mg  0.25 mg Oral BID Mike Craze, MD      . risperiDONE (RISPERDAL) tablet 0.5 mg  0.5 mg Oral QHS Mike Craze, MD      . DISCONTD: citalopram (CELEXA) tablet 20 mg  20 mg Oral Daily Mike Craze, MD      . DISCONTD: citalopram (CELEXA) tablet 30 mg  30 mg Oral Daily Mike Craze, MD   30 mg at 07/18/12 7829   Facility-Administered Medications Ordered in Other Encounters  Medication Dose Route Frequency Provider Last Rate Last Dose  . DISCONTD: acetaminophen (TYLENOL) tablet 650 mg  650 mg Oral Q6H PRN Richardean Canal, MD   650 mg at 07/16/12 1035  . DISCONTD: citalopram (CELEXA) tablet 30 mg  30 mg Oral Daily Raeford Razor, MD   30 mg at 07/17/12 1021    Lab Results: No results found for this or any previous visit (from the past 48 hour(s)).  Physical Findings: AIMS:  , ,  ,  ,  CIWA:    COWS:     Treatment Plan Summary: Daily contact with patient to assess and evaluate symptoms and progress in treatment Medication management No suicidal or homicidal thoughts for at least 48 hours. Mood/anxiety less than 3/10 where the scale is 1 is the best and 10 is the worst  Plan: Admit, start Risperdal continue Celexa, but consider higher dose soon, plan to refer to DBT for group follow up .  Discussed the risks, benefits, and probable clinical course with and without treatment.  Pt is agreeable to the current course of treatment.  Zorion Nims 07/18/2012, 5:08 PM

## 2012-07-19 DIAGNOSIS — F322 Major depressive disorder, single episode, severe without psychotic features: Secondary | ICD-10-CM

## 2012-07-19 NOTE — Progress Notes (Signed)
The patient attended group this evening, but slept for the duration despite this author's attempt to rouse her.

## 2012-07-19 NOTE — Progress Notes (Signed)
Psychoeducational Group Note  Date:  07/19/2012 Time: 1015  Group Topic/Focus:  Identifying Needs:   The focus of this group is to help patients identify their personal needs that have been historically problematic and identify healthy behaviors to address their needs.  Participation Level:  Active  Participation Quality:  Appropriate  Affect:  Appropriate  Cognitive:  Alert  Insight:  Good  Engagement in Group:  Good  Additional Comments:    07/19/2012,4:15 PM Mykel Sponaugle, Joie Bimler

## 2012-07-19 NOTE — Progress Notes (Signed)
[facility] Group Notes:  (Counselor/Nursing/MHT/Case Management/Adjunct)  07/19/2012 3:41 PM  Type of Therapy:  Group Therapy  Participation Level:  Active  Participation Quality:  Appropriate and Attentive  Affect:  Appropriate  Cognitive:  Appropriate  Insight:  Good  Engagement in Group:  Good  Engagement in Therapy:  Good  Modes of Intervention:  Clarification, Education, Socialization and Support  Summary of Progress/Problems: Pt. participated in group discussion on  Self sabotaging behaviors and how to change the way they think in order to make necessary changes in their live in positive ways. Each pt. Shared their self sabotaging behavior and what they can  Do to positively enable themselves. Each pt. Shared what self sabotaging meant to them. The pt. Spoke about how she lives in fear of being hurt in relationships and  how she chases away relationships . Pt. States she will work on not chasing away relationships and states she is glad she came to hospital in the fact that she found out where self sabotaging behavior of  Chasing away relationships.  Neila Gear 07/19/2012, 3:41 PM

## 2012-07-19 NOTE — Progress Notes (Signed)
D) Pt. Rates her depression and hopelessness at a 6. Denies SI and HI. Has attended the groups and interacts with her peers appropriately. Participates in the groups and has verbalized some insight into her issues and behaviors. A) given support, reassurance and praise. Encouraged to work on her packet. R) Denies SI and HI.

## 2012-07-19 NOTE — Progress Notes (Signed)
Patient ID: Brianna Cordova, female   DOB: 08-Dec-1990, 21 y.o.   MRN: [id number]  Problem: Major Depressive Disorder, Borderline Personality  D: Pt withdrawn and quiet in milieu, little peer interaction.  A: Monitor patient Q 15 minutes for safety, encourage more staff/peer interaction, administer medications as ordered by MD.  R: Pt remains withdrawn and endorses depression. No inappropriate behaviors noted. Pt compliant with medications.

## 2012-07-19 NOTE — Progress Notes (Signed)
[facility] Group Notes:  (Counselor/Nursing/MHT/Case Management/Adjunct)  07/19/2012 3:25 PM Type of Therapy:  After care Planning Group  Pt. Participated in After Care Planning group and was given [facility] SI pamphlet and crisis hotline numbers and agreed to use them if needed.  The pt. Spoke about being at [facility] due to depression and SI thoughts. Pt. Discussed er struggles and talked throughout the group.  Lamar Blinks [place] 07/19/2012, 3:25 PM

## 2012-07-19 NOTE — Progress Notes (Signed)
[facility] Adult Inpatient Family/Significant Other Suicide Prevention Education  Suicide Prevention Education:  Contact Attempts: William  [place]-[phone]-Pt.'s father has been identified by the patient as the family member/significant other with whom the patient will be residing, and identified as the person(s) who will aid the patient in the event of a mental health crisis.  With written consent from the patient, two attempts were made to provide suicide prevention education, prior to and/or following the patient's discharge.  We were unsuccessful in providing suicide prevention education.  A suicide education pamphlet was given to the patient to share with family/significant other.  Date and time of first attempt: by Lamar Blinks on 07/19/12 at 4:03 am Date and time of second attempt:  Neila Gear 07/19/2012, 4:02 PM

## 2012-07-19 NOTE — Progress Notes (Addendum)
[facility] MD Progress Note  07/19/2012 2:39 PM  Diagnosis:   Axis I: Major Depression, single episode Axis II: Deferred Axis III:  Past Medical History  Diagnosis Date  . Depression   . Epilepsy    Subjective: Brianna Cordova reports that she is doing "pretty good" today. She is tolerating her medications well, and reports only some mild sedation from the Risperdal. She excitedly announces that she got a good night sleep last night for the first time in a long time. She awoke this morning feeling refreshed. She denies any suicidal ideation. She does endorse some homicidal ideation, but denies that it is directed at anyone in particular. She denies any auditory or visual hallucinations today.  ADL's:  Intact  Sleep: Good  Appetite:  Good  Suicidal Ideation:  Patient denies any thought, plan, or intent Homicidal Ideation:  Patient endorses thought, but denies plan or intent  AEB (as evidenced by):  Mental Status Examination/Evaluation: Objective:  Appearance: Casual and Fairly Groomed  Eye Contact::  Good  Speech:  Clear and Coherent  Volume:  Normal  Mood:  Euthymic  Affect:  Congruent  Thought Process:  Linear  Orientation:  Full  Thought Content:  WDL  Suicidal Thoughts:  No  Homicidal Thoughts:  Yes.  without intent/plan  Memory:  Immediate;   Good Recent;   Good Remote;   Good  Judgement:  Fair  Insight:  Fair  Psychomotor Activity:  Normal  Concentration:  Good  Recall:  Good  Akathisia:  No  Handed:    AIMS (if indicated):     Assets:  Desire for Improvement  Sleep:  Number of Hours: 6.75    Vital Signs:Blood pressure 105/68, pulse 84, temperature 98.2 F (36.8 C), temperature source Oral, resp. rate 16, height 5\' 2"  (1.575 m), weight 64.411 kg (142 lb), last menstrual period 07/05/2012. Current Medications: Current Facility-Administered Medications  Medication Dose Route Frequency Provider Last Rate Last Dose  . acetaminophen (TYLENOL) tablet 650 mg  650 mg Oral Q6H  PRN Mike Craze, MD      . alum & mag hydroxide-simeth (MAALOX/MYLANTA) 200-200-20 MG/5ML suspension 30 mL  30 mL Oral Q4H PRN Mike Craze, MD      . citalopram (CELEXA) tablet 30 mg  30 mg Oral Daily Mike Craze, MD   30 mg at 07/19/12 0810  . hydrOXYzine (ATARAX/VISTARIL) tablet 25 mg  25 mg Oral QHS PRN,MR X 1 Mike Craze, MD      . magnesium hydroxide (MILK OF MAGNESIA) suspension 30 mL  30 mL Oral Daily PRN Mike Craze, MD   30 mL at 07/18/12 1522  . risperiDONE (RISPERDAL) tablet 0.25 mg  0.25 mg Oral BID Mike Craze, MD   0.25 mg at 07/19/12 0811  . risperiDONE (RISPERDAL) tablet 0.5 mg  0.5 mg Oral QHS Mike Craze, MD   0.5 mg at 07/18/12 2107    Lab Results: No results found for this or any previous visit (from the past 48 hour(s)).  Physical Findings: AIMS: Facial and Oral Movements Muscles of Facial Expression: None, normal Lips and Perioral Area: None, normal Jaw: None, normal Tongue: None, normal,Extremity Movements Upper (arms, wrists, hands, fingers): None, normal Lower (legs, knees, ankles, toes): None, normal, Trunk Movements Neck, shoulders, hips: None, normal, Overall Severity Severity of abnormal movements (highest score from questions above): None, normal Incapacitation due to abnormal movements: None, normal Patient's awareness of abnormal movements (rate only patient's report): No Awareness, Dental Status Current  problems with teeth and/or dentures?: No Does patient usually wear dentures?: No  CIWA:  CIWA-Ar Total: 0  COWS:  COWS Total Score: 0   Treatment Plan Summary: Daily contact with patient to assess and evaluate symptoms and progress in treatment Medication management  Plan: We will continue her current plan of care.  Brianna Cordova 07/19/2012, 2:39 PM

## 2012-07-20 NOTE — Progress Notes (Signed)
[facility] Group Notes:  (Counselor/Nursing/MHT/Case Management/Adjunct)  07/20/2012 5:20 PM  Type of Therapy:  Group Therapy  Participation Level:  Active  Participation Quality:  Appropriate and Attentive  Affect:  Appropriate  Cognitive:  Appropriate  Insight:  Good  Engagement in Group:  Good  Engagement in Therapy:  Good  Modes of Intervention:  Clarification, Education, Socialization and Support  Summary of Progress/Problems: Pt. participated in group on health support systems and was asked who they have in their lives that are a support and how to find supports when their supports are not able to be there. The pt. Spoke about what support means to them and the difference between healthy and unhealthy supports. Each pt. participated in a support activity at the end of the group.  The pt. Spoke about her father  And her mother being a support to her.  Lamar Blinks [place] 07/20/2012, 5:20 PM

## 2012-07-20 NOTE — Progress Notes (Signed)
[facility] Group Notes:  (Counselor/Nursing/MHT/Case Management/Adjunct)  07/20/2012 4:14 PM  Type of Therapy:  After Care Planning Group  Pt. participated in after care planning group and was given [facility] SI pamphlet and crisis  hot line number. The pt. Agreed  to use them if needed. The pt. Was also ive information on the [facility],  And the The[facility] crisis hot line number and agreed to use them if needed. Each member of group was encouraged to attend support groups and utilize community resources for support.  Pt. Stated she was doing better today and denied SI/HI today. The pt. Stated she slept well and that her appetite has gotten better. The pt. saw Hessie Diener, the PA yesterday and no changes were made to her medications.   Lamar Blinks [place] 07/20/2012, 4:14 PM

## 2012-07-20 NOTE — Progress Notes (Signed)
Psychoeducational Group Note  Date:  07/20/2012 Time: 1015 Group Topic/Focus:  Making Healthy Choices:   The focus of this group is to help patients identify negative/unhealthy choices they were using prior to admission and identify positive/healthier coping strategies to replace them upon discharge.  Participation Level:  Minimal  Participation Quality:  Appropriate  Affect:  Anxious  Cognitive:  Appropriate  Insight:  Good  Engagement in Group:  Limited  Additional Comments:    Rich Brave 1:30 PM. 07/20/2012

## 2012-07-20 NOTE — Progress Notes (Signed)
[facility] MD Progress Note  07/20/2012 1:27 PM  Diagnosis:   Axis I: Major Depression, Recurrent severe Axis II: Deferred Axis III:  Past Medical History  Diagnosis Date  . Depression   . Epilepsy    Subjective: Brianna Cordova reports that she is doing very well today. She endorses that she continues to sleep well, and that her appetite is good. She denies any suicidal or homicidal ideation. He denies any auditory or visual hallucinations.   ADL's:  Intact  Sleep: Good  Appetite:  Good  Suicidal Ideation:  Patient denies any thought, plan, or intent Homicidal Ideation:  Patient denies any thought, plan, or intent  AEB (as evidenced by):  Mental Status Examination/Evaluation: Objective:  Appearance: Casual  Eye Contact::  Good  Speech:  Clear and Coherent  Volume:  Normal  Mood:  Euthymic  Affect:  Appropriate  Thought Process:  Linear  Orientation:  Full  Thought Content:  WDL  Suicidal Thoughts:  No  Homicidal Thoughts:  No  Memory:  Immediate;   Good Recent;   Good Remote;   Good  Judgement:  Good  Insight:  Fair  Psychomotor Activity:  Normal  Concentration:  Good  Recall:  Good  Akathisia:  No  Handed:    AIMS (if indicated):     Assets:  Communication Skills Desire for Improvement  Sleep:  Number of Hours: 6.75    Vital Signs:Blood pressure 114/73, pulse 89, temperature 98.2 F (36.8 C), temperature source Oral, resp. rate 16, height 5\' 2"  (1.575 m), weight 64.411 kg (142 lb), last menstrual period 07/05/2012. Current Medications: Current Facility-Administered Medications  Medication Dose Route Frequency Provider Last Rate Last Dose  . acetaminophen (TYLENOL) tablet 650 mg  650 mg Oral Q6H PRN Mike Craze, MD      . alum & mag hydroxide-simeth (MAALOX/MYLANTA) 200-200-20 MG/5ML suspension 30 mL  30 mL Oral Q4H PRN Mike Craze, MD      . citalopram (CELEXA) tablet 30 mg  30 mg Oral Daily Mike Craze, MD   30 mg at 07/20/12 0803  . hydrOXYzine  (ATARAX/VISTARIL) tablet 25 mg  25 mg Oral QHS PRN,MR X 1 Mike Craze, MD      . magnesium hydroxide (MILK OF MAGNESIA) suspension 30 mL  30 mL Oral Daily PRN Mike Craze, MD   30 mL at 07/18/12 1522  . risperiDONE (RISPERDAL) tablet 0.25 mg  0.25 mg Oral BID Mike Craze, MD   0.25 mg at 07/20/12 0803  . risperiDONE (RISPERDAL) tablet 0.5 mg  0.5 mg Oral QHS Mike Craze, MD   0.5 mg at 07/19/12 2210    Lab Results: No results found for this or any previous visit (from the past 48 hour(s)).  Physical Findings: AIMS: Facial and Oral Movements Muscles of Facial Expression: None, normal Lips and Perioral Area: None, normal Jaw: None, normal Tongue: None, normal,Extremity Movements Upper (arms, wrists, hands, fingers): None, normal Lower (legs, knees, ankles, toes): None, normal, Trunk Movements Neck, shoulders, hips: None, normal, Overall Severity Severity of abnormal movements (highest score from questions above): None, normal Incapacitation due to abnormal movements: None, normal Patient's awareness of abnormal movements (rate only patient's report): No Awareness, Dental Status Current problems with teeth and/or dentures?: No Does patient usually wear dentures?: No  CIWA:  CIWA-Ar Total: 0  COWS:  COWS Total Score: 0   Treatment Plan Summary: Daily contact with patient to assess and evaluate symptoms and progress in treatment Medication management  Plan: We will continue her current plan of care, and firm up followup plans prior to discharge.  Brianna Cordova 07/20/2012, 1:27 PM

## 2012-07-20 NOTE — Progress Notes (Signed)
D) Pt has attended the program and interacts with her peers. States she is still angry with her mother but does not want to kill her anymore. Pt feels that the relationship was fine until Pt told her mother (at age 21) that she hated her and did not want to live with her anymore. Pt states that the relationship started to deteriorate after that. States that she wants her mother to love her but she feels that her mother is disrespectful and not caring about her. Pt is childlike in her description of her relationship with her mother. Denies SI and HI today  A) Given support and provided with a 1:1. Encouraged to talk about her feelings and to begin talking with her mother. R) Pt remains childlike and immature in her understanding. Does not want to hurt her mother anymore

## 2012-07-20 NOTE — Progress Notes (Signed)
Patient came to medication window and received her scheduled hs medication. Patient had been in her room asleep. Writer asked if she felt too sedated and she reported that she hasn't rested well for a few days.  Patient voiced no complaints, currently denies having pain, -si/hi/a/v hall.Safety maintained on unit, will continue to monitor.

## 2012-07-20 NOTE — Progress Notes (Signed)
[facility] Adult Inpatient Family/Significant Other Suicide Prevention Education  Suicide Prevention Education:  Education Completed; Brianna Cordova [phone] (cell) or [phone](home) has been identified by the patient as the family member/significant other with whom the patient will be residing, and identified as the person(s) who will aid the patient in the event of a mental health crisis (suicidal ideations/suicide attempt).  With written consent from the patient, the family member/significant other has been provided the following suicide prevention education, prior to the and/or following the discharge of the patient.  The suicide prevention education provided includes the following:  Suicide risk factors  Suicide prevention and interventions  National Suicide Hotline telephone number  [facility] assessment telephone number  [facility] Emergency Assistance 911  [facility] and/or Residential Mobile Crisis Unit telephone number  Request made of family/significant other to:  Remove weapons (e.g., guns, rifles, knives), all items previously/currently identified as safety concern.   Pt.'s father states there are no guns/weapons in the home  Remove drugs/medications (over-the-counter, prescriptions, illicit drugs), all items previously/currently identified as a safety concern. Pt. Had no questions or concerns.  Pt. Reports no past history of SI attempts or problems with SI thoughts. Pt. Father states he was surprised by pt.S admission of SI and her being admitted to [facility]. Pt. Father was given ccrisisnumbers and agreed to use them if needed.  Pt.'s father can be reached at the number above.  The family member/significant other verbalizes understanding of the suicide prevention education information provided.  The family member/significant other agrees to remove the items of safety concern listed above.  Brianna Cordova Brianna Cordova 07/20/2012, 12:05 PM

## 2012-07-21 MED ORDER — CITALOPRAM HYDROBROMIDE 20 MG PO TABS: 30.0000 mg | ORAL_TABLET | Freq: Every day | ORAL | Status: DC

## 2012-07-21 MED ORDER — RISPERIDONE 0.25 MG PO TABS: ORAL_TABLET | ORAL | Status: DC

## 2012-07-21 MED ORDER — FERROUS SULFATE 325 (65 FE) MG PO TABS: 325.0000 mg | ORAL_TABLET | Freq: Every day | ORAL | Status: DC

## 2012-07-21 NOTE — [facility] Suicide Risk Assessment (Signed)
Suicide Risk Assessment  Discharge Assessment    Current Mental Status by Physician: Patient denies suicidal or homicidal ideation, hallucinations, illusions, or delusions. Patient engages with good eye contact, is able to focus adequately in a one to one setting, and has clear goal directed thoughts. Patient speaks with a natural conversational volume, rate, and tone. Anxiety was reported at 1 on a scale of 1 the least and 10 the most. Depression was reported at 1 on the same scale. Patient is oriented times 4, recent and remote memory intact. Judgement: Improved from admission Insight: Improved from admission  Demographic factors:  Adolescent or young adult Current Mental Status:   (denies) Loss Factors:  Loss of significant relationship;Financial problems / change in socioeconomic status Historical Factors:    Risk Reduction Factors:  Religious beliefs about death;Employed;Living with another person, especially a relative;Positive social support  Continued Clinical Symptoms:  Severe Anxiety and/or Agitation Depression:   Insomnia Personality Disorders:   Cluster B Previous Psychiatric Diagnoses and Treatments  Discharge Diagnoses: Axis I: Major Depression, Recurrent severe  Axis II: Cluster B Traits  Axis III:  Past Medical History   Diagnosis  Date   .  Depression    .  Epilepsy    Axis IV: other psychosocial or environmental problems  Axis V: 61-70 mild symptoms  Cognitive Features That Contribute To Risk:  Thought constriction (tunnel vision)    Suicide Risk:  Minimal: No identifiable suicidal ideation.  Patients presenting with no risk factors but with morbid ruminations; may be classified as minimal risk based on the severity of the depressive symptoms  Labs:  No results found for this or any previous visit (from the past 72 hour(s)).  RISK REDUCTION FACTORS: What pt has learned from hospital stay is that they do have a support system and they need relationships  and there are important things that need to be done to keep those relationships positive  Risk of self harm is elevated by her impulsivity and depression, but they have realized that they have themselves and their goals to live for.  Risk of harm to others is elevated by her past history of having homicidal plan to kill her mother, but she has acquired a deeper appreciation for life, her own life, and how to avoid some of the conflict that leads to her desires to harm her mother.  Pt seen in treatment team where she divulged the above information. The treatment team concluded that she was ready for discharge and had met her goals for an inpatient setting.  PLAN: Discharge home Continue Medication List  As of 07/21/2012 12:57 PM   TAKE these medications      Indication    citalopram 20 MG tablet   Commonly known as: CELEXA   Take 1.5 tablets (30 mg total) by mouth daily. For depression.       ferrous sulfate 325 (65 FE) MG tablet   Take 1 tablet (325 mg total) by mouth daily with breakfast. For iron replacement.       risperiDONE 0.25 MG tablet   Commonly known as: RISPERDAL   Take by mouth ONE twice a day and TWO at bed time.            Follow-up recommendations:  Activities: Resume typical activities Diet: Resume typical diet Tests: none Other: Follow up with outpatient provider and report any side effects to out patient prescriber.  Sheppard Luckenbach 07/21/2012, 12:55 PM

## 2012-07-21 NOTE — Progress Notes (Signed)
[facility] Group Notes:  (Counselor/Nursing/MHT/Case Management/Adjunct)  07/21/2012 3:53 AM  Type of Therapy:  Psychoeducational Skills  Participation Level:  Active  Participation Quality:  Appropriate  Affect:  Appropriate  Cognitive:  Appropriate  Insight:  Good  Engagement in Group:  Good  Engagement in Therapy:  Good  Modes of Intervention:  Education  Summary of Progress/Problems: The patient verbalized that she had a good for a number of reasons. First of all, she slept well. Secondly, she didn't experience any "bad thoughts". Finally, she had a good family visit. Her goal for tomorrow is to begin using the coping skills that she learned in group today.    Brianna Cordova 07/21/2012, 3:53 AM

## 2012-07-21 NOTE — Progress Notes (Signed)
[facility] Group Notes: (Counselor/Nursing/MHT/Case Management/Adjunct) 07/21/2012   @  1:15-2:30pm Overcoming Obstacles to Wellness   Type of Therapy:  Group Therapy  Participation Level:  Active  Participation Quality: Appropriate, Sharing, Supportive   Affect:  Appropriate  Cognitive:  Appropriate  Insight:  Good  Engagement in Group: Good  Engagement in Therapy:  Limited  Modes of Intervention:  Support and Exploration  Summary of Progress/Problems: Brianna Cordova explored how she has been her own obstacle by allowing what others say to become a part of her, rather than exercising her right and ability to filter. She shared about hearing terrible things about herself growing up, and how she accepted these things rather than finding her own views about herself, thus leading to anger and depression. Brianna Cordova processed her needs for self-worth and how that needs to come from her rather than her looking to others to provide it for her. She was very supportive to other group members and seemed to relate to them as they shared about allowing themselves to be treated badly or punishing themselves as internal obstacles to wellness.  Angus Palms, LCSW 07/21/2012  3:23 PM

## 2012-07-21 NOTE — Progress Notes (Signed)
Pt is D/C home. Pt denies SI/HI/AV. Pt follow-up and medications were reviewed and pt verbalized understanding. Pt pleasant and cooperative. Pt belongings were returned.   

## 2012-07-21 NOTE — Progress Notes (Signed)
Patient observed in dayroom and hallway laughing and talking with select peers. Patient had to be redirected a few times b/c she was getting loud. Writer spoke with patient 1 on 1 and she reported that family members visited her today and she is fortunate to have supportive family members. Patient reports that the risperdal helps her to sleep since before she difficulty sleeping. Patient offered support and encouragement, she voiced no complaints and feels ready to be discharged. -si/hi/a/v hall. Safety maintained on unit will continue to monitor.

## 2012-07-21 NOTE — Progress Notes (Signed)
[facility] Case Management Discharge Plan:  Will you be returning to the same living situation after discharge: No.  Patient will live with aunt rather than parents at discharge At discharge, do you have transportation home?:Yes,  Patient to arrange transportation home Do you have the ability to pay for your medications:Yes,  Patient has private insurance  Interagency Information:     Release of information consent forms completed and in the chart;  Patient's signature needed at discharge.  Patient to Follow up at:  Follow-up Information    Follow up with Tamala Fothergill.   Contact information:   [address] [place], [place]  [address]  [phone]      Follow up with [facility]. (Please call [phone] to schedule DBT classes with [facility])       Follow up with Dr. Theotis Barrio - [facility] on 07/23/2012. (You are scheduled with Dr. Theotis Barrio on Wednesday, Septe mber 6, 2013 at [facility])    Contact information:   [facility] [address] [place], [place]  [address]  [phone]         Patient denies SI/HI:   Yes,  Patient denies SI/HI or thoughts of self harm    Safety Planning and Suicide Prevention discussed:  Yes,  Reviewed during after care group  Barrier to discharge identified:No.  Summary and Recommendations: Patient encourage to be compliant with medications and follow up with outpatient recommedations    Gayleen Sholtz, [place] July 07/21/2012, 2:42 PM

## 2012-07-21 NOTE — Discharge Summary (Signed)
Physician Discharge Summary Note  Patient:  Brianna Cordova is an 21 y.o., female MRN:  [id number] DOB:  1991-04-24 Patient phone:  [phone] (home)  Patient address:   [address] [place] [address],   Date of Admission:  07/17/2012 Date of Discharge: 07/21/12  Reason for Admission: Suicide thoughts.  Discharge Diagnoses: Principal Problem:  *MDD (major depressive disorder), single episode, severe with psychotic features Active Problems:  Borderline behavior   Axis Diagnosis:   AXIS I:  MDD (major depressive disorder), single episode, severe with psychotic features AXIS II:  Cluster B Traits AXIS III:   Past Medical History  Diagnosis Date  . Depression   . Epilepsy    AXIS IV:  other psychosocial or environmental problems AXIS V:  65  Level of Care:  OP  Hospital Course: This is a 21 year old African-American female, admitted to [facility] from the [facility] with complaints of suicidal/homicidal thoughts. Patient reports, "My aunt took me to the hospital last Tuesday. I was thinking about committing suicide by getting into my car and drive into something and then die from the injuries that I will sustain. I was just finishing my counseling session with my therapist, [name]. As I was leaving from meeting with her, I got into my car and had this thought of starting my car and drove it till I wreck it on something. But I did not do just that, rather, I got out of my car, went back to my therapist and told her what I was thinking. She insisted that I have to come to the hospital. I have been depressed since my elementary school. I had no friends. A lot of kids called me names. They teased and bullied me. But I did not have anyone to confide in then. I am an only child. I don't get along with my mother. I do get along with my father, he is always working and when he gets home, he will be too tired to do anything else. My mother does not let it easy on me. She is  constantly nagging me, if it was not doing the dishes, then it will be living my light on while I was out of my room. There is no getting a break from her mouth. She runs her mouth all the time. I started taking depression medicine for about 1 year now. I feel like it is not working. A lot of people notices my mood. They tell me that my mood is always flipping on me. I have a lot of stressors in my life. 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 A lot of times, I will get so upset about how I was bullied and teased in my younger years that I thought about finding these people and hurt them. I don't sleep at night. I hear voices".   While a patient in this hospital, Brianna Cordova received medication management for her depression. She was prescribed and received Citalopram 30 mg daily for depression and Risperdal 0.5 mg and 0.25 mg respectively for mood control/anxiety. She was also enrolled in group counseling sessions and activities to learn coping skills that should help her maintain stability and manage her symptoms after discharge. Patient also received medication management and monitoring for her other minor medical issues, complaints and concerns. She tolerated her treatment regimen without any significant adverse effects and or reactions.  Patient did respond  well to her treatment regimen gradually on daily basis. This is evidenced by her daily reports of improved mood, reduction of symptoms and presentation of good affect/eye contact. However, she did on one occasion in the early part of her admission, a report of passive intrusive homicidal thoughts towards her mother but denies any plans and or intent to harm her mother, self and or others. Patient attended treatment team meeting this am and met with the treatment team members. Her reason for admission, present symptoms, treatment plans and response to treatment discussed. Patient endorsed that she  is doing well and stable for discharge. It was agreed upon that she will continue psychiatric care on outpatient basis with Rasul with the [facility] on 07/23/12 @ 09:00 am and with Tamala Fothergill located at [address] in [place], [place]. She is also encouraged and instructed to call the [facility] at [phone] to schedule DBT counseling sessions. Phone number provided. The addresses, dates and times for these appointments provided for patient.  Upon discharge, patient adamantly denies suicidal, homicidal ideations, auditory, visual hallucinations and or delusional thinking. She received from [facility] a 2 weeks worth supply samples of her [facility] discharge medications. She left [facility] with all personal belongings via family transport in no apparent distress.    Consults:  None  Significant Diagnostic Studies:  labs: CBC with diff, CMP, Toxicology  Discharge Vitals:   Blood pressure 116/81, pulse 112, temperature 98.7 F (37.1 C), temperature source Oral, resp. rate 16, height 5\' 2"  (1.575 m), weight 64.411 kg (142 lb), last menstrual period 07/05/2012.  Physical Findings: AIMS: Facial and Oral Movements Muscles of Facial Expression: None, normal Lips and Perioral Area: None, normal Jaw: None, normal Tongue: None, normal,Extremity Movements Upper (arms, wrists, hands, fingers): None, normal Lower (legs, knees, ankles, toes): None, normal, Trunk Movements Neck, shoulders, hips: None, normal, Overall Severity Severity of abnormal movements (highest score from questions above): None, normal Incapacitation due to abnormal movements: None, normal Patient's awareness of abnormal movements (rate only patient's report): No Awareness, Dental Status Current problems with teeth and/or dentures?: No Does patient usually wear dentures?: No  CIWA:  CIWA-Ar Total: 0  COWS:  COWS Total Score: 0   Mental Status Exam: See Mental Status Examination and Suicide Risk  Assessment completed by Attending Physician prior to discharge.  Discharge destination:  Home  Is patient on multiple antipsychotic therapies at discharge:  No   Has Patient had three or more failed trials of antipsychotic monotherapy by history:  No  Recommended Plan for Multiple Antipsychotic Therapies: Na   Medication List  As of 07/21/2012  4:31 PM   TAKE these medications      Indication    citalopram 20 MG tablet   Commonly known as: CELEXA   Take 1.5 tablets (30 mg total) by mouth daily. For depression.       ferrous sulfate 325 (65 FE) MG tablet   Take 1 tablet (325 mg total) by mouth daily with breakfast. For iron replacement.       risperiDONE 0.25 MG tablet   Commonly known as: RISPERDAL   Take by mouth ONE twice a day and TWO at bed time.            Follow-up Information    Follow up with Tamala Fothergill.   Contact information:   [address] [place], [place]  [address]  [phone]      Follow up with [facility]. (Please call  [phone] to schedule DBT classes with [facility])       Follow up with Dr. Theotis Barrio - [facility] on 07/23/2012. (You are scheduled with Dr. Theotis Barrio on Wednesday, [facility] mber 6[facility])    Contact information:   [facility] [address] [place], [place]  [address]  [phone]         Follow-up recommendations: Activity:  as tolerated Other:  Keep all scheduled follow-up appointments as recommended.     Comments:  Take all your medications as prescribed by your mental healthcare provider. Report any adverse effects and or reactions from your medicines to your outpatient provider promptly. Patient is instructed and cautioned to not engage in alcohol and or illegal drug use while on prescription medicines. In the event of worsening symptoms, patient is instructed to call the crisis hotline, 911 and or go to the nearest ED for appropriate evaluation and treatment  of symptoms. Follow-up with your primary care provider for your other medical issues, concerns and or health care needs.     SignedArmandina Stammer I 07/21/2012, 4:31 PM

## 2012-07-21 NOTE — Tx Team (Signed)
Interdisciplinary Treatment Plan Update (Adult)  Date:  07/21/2012  Time Reviewed:  10:32 AM   Progress in Treatment: Attending groups: Yes Participating in groups:  Yes Taking medication as prescribed:  Yes Tolerating medication: Yes Family/Significant othe contact made:  Yes, contact made with: father, Brianna Cordova Patient understands diagnosis: Yes Discussing patient identified problems/goals with staff:  Yes Medical problems stabilized or resolved: Yes Denies suicidal/homicidal ideation: Yes Issues/concerns per patient self-inventory:  No  Other:  New problem(s) identified: None  Reason for Continuation of Hospitalization: Appropriate for discharge today  Interventions implemented related to continuation of hospitalization:  Medication stabilization, safety checks q 15 mins, group attendance  Additional comments:  Estimated length of stay: discharge today  Discharge Plan: Brianna Cordova will discharge to live with aunt and will follow up with outpatient services offered by [facility]  New goal(s):  Review of initial/current patient goals per problem list:   1. Goal(s): Decrease depressive symptoms to rating of 4 or less  Met: Yes Target date: by discharge  As evidenced by: Brianna Cordova rates depression at 0    2. Goal (s): Reduce potential for suicide/self-harm  Met: Yes  Target date: by discharge  As evidenced by: Brianna Cordova reports no suicidal thoughts today   3. Goal(s): Reduce potential for harm to others/homicide  Met: Yes Target date: by discharge  As evidenced by: Brianna Cordova denies any homicidal thoughts for the past few days, reports she does not want to kill mom anymore but wants to no be around her as she recognizes mom as a trigger  4. Goal(s): Medication stabilization  Met: Yes Target date: by discharge  As evidenced by: Brianna Cordova reports medications have helped to decrease symptoms and have no intolerable side effects  Attendees: Patient:   Brianna Cordova 07/21/2012 10:32 AM  Family:     Physician:  Dr Orson Aloe, MD 07/21/2012 10:32 AM  Nursing:   Rodman Key, RN 07/21/2012 10:32 AM  Case Manager:  Juline Patch, LCSW 07/21/2012 10:32 AM  Counselor:  Angus Palms, LCSW 07/21/2012 10:32 AM  Other:  Reyes Ivan, LCSWA 07/21/2012 10:32 AM  Other:  Quintella Reichert, RN 07/21/2012 10:32 AM  Other:     Other:      Scribe for Treatment Team:   Billie Lade, 07/21/2012 10:32 AM

## 2012-07-21 NOTE — Progress Notes (Signed)
Pt. Very excited to be discharged today. Pt states she will go to live with her aunt and uncle. She stated she no longer has HI towards her mom and that she has a lot to live for and a lot of people that care about her. Pt stated she finds when she is around her mother she gets feelings of HI and Si and stated her plan is just to see her mom  Maybe once a week,. She plans to use her coping skills that she learned here in her everyday life activities. Pt did appear in team meeting very animated with good eye contact and aware of why she was here and what she needs to do to make life changes.

## 2012-07-21 NOTE — Progress Notes (Signed)
[facility] Group Notes:  (Counselor/Nursing/MHT/Case Management/Adjunct)  07/21/2012 4:43 PM  Type of Therapy:  Psychoeducational Skills  Participation Level:  Active  Participation Quality:  Appropriate  Affect:  Appropriate  Cognitive:  Appropriate  Insight:  Good  Engagement in Group:  Good  Engagement in Therapy:  Good  Modes of Intervention:  Education  Summary of Progress/Problems:Patient were instructed to define what self care means to them. Patient was instructed to identify three areas where they do not take care of themselves, and to focus on their strengths while doing so. Patient was encouraged to discuss barriers to why they have not incorporated self care into their lives in the past. Staff instructed the patient to identify one area of their life that they will start to work on. Staff concluded the group by reviewing why it is important to take care of self. Patient was able to openly discuss why she has failed to to take care of her self in the past and stated that she is going to focus on taking it one day at a time in order to embrace the concept of self care.    Ardelle Park O 07/21/2012, 4:43 PM

## 2012-07-23 NOTE — Progress Notes (Signed)
Patient Discharge Instructions:  After Visit Summary (AVS):   Certified Mail to:  07/23/2012 Psychiatric Admission Assessment Note:   Certified Mail to:  07/23/2012 Suicide Risk Assessment - Discharge Assessment:   Certified Mail to:  07/23/2012 Faxed/Sent to the Next Level Care provider:  07/23/2012  No fax number available for Wallie Char, sent via certified mail to: [address] [place], [place] [address] And to [facility] @ [address] [place], [place] [address]   Wandra Scot, 07/23/2012, 6:50 PM

## 2012-08-02 NOTE — Discharge Summary (Signed)
I agree with this D/C Summary.  

## 2013-01-13 ENCOUNTER — Ambulatory Visit (HOSPITAL_COMMUNITY): Payer: Self-pay | Admitting: Physician Assistant

## 2013-02-18 ENCOUNTER — Encounter (HOSPITAL_COMMUNITY): Payer: Self-pay | Admitting: Physician Assistant

## 2013-02-18 ENCOUNTER — Ambulatory Visit (INDEPENDENT_AMBULATORY_CARE_PROVIDER_SITE_OTHER): Payer: No Typology Code available for payment source | Admitting: Physician Assistant

## 2013-02-18 DIAGNOSIS — F39 Unspecified mood [affective] disorder: Secondary | ICD-10-CM

## 2013-02-18 MED ORDER — RISPERIDONE 0.25 MG PO TABS: ORAL_TABLET | ORAL | Status: DC

## 2013-02-18 NOTE — Progress Notes (Signed)
Psychiatric Assessment Adult  Patient Identification:  Brianna Cordova Date of Evaluation:  02/18/2013 Chief Complaint: "I've been off my Risperdal for one week, and not other doctor won't prescribe it." History of Chief Complaint:    HPI Brianna Cordova was hospitalized at [facility] in September of 2013 after having suicidal thoughts to reck her car. She endorses previous thoughts of suicide beginning around age 22. She's had a couple of attempts, the last one as a freshman in high school, and a previous one to walk out into traffic when she was in middle school. She denies that she's had any depression or anxiety since she was discharged from the hospital in September, but she is having a significant amount of irritability and racing thoughts, with difficulty in sleeping. Review of Systems  Constitutional: Negative.   HENT: Negative for hearing loss, ear pain and tinnitus.   Eyes: Positive for visual disturbance (Near sighted). Negative for photophobia and pain.  Respiratory: Negative.   Cardiovascular: Negative.   Gastrointestinal: Negative for nausea, vomiting, abdominal pain, diarrhea, constipation and blood in stool.  Endocrine: Negative.   Genitourinary: Negative.   Musculoskeletal: Negative.   Skin: Negative.   Allergic/Immunologic: Positive for food allergies (Black pepper). Negative for environmental allergies.  Neurological: Negative.   Hematological: Negative.   Psychiatric/Behavioral: Positive for behavioral problems, sleep disturbance, dysphoric mood and agitation. Negative for suicidal ideas, hallucinations, confusion, self-injury and decreased concentration. The patient is nervous/anxious. The patient is not hyperactive.    Physical Exam  Constitutional: She is oriented to person, place, and time. She appears well-developed and well-nourished.  HENT:  Head: Normocephalic and atraumatic.  Eyes: Conjunctivae are normal. Pupils are equal, round, and reactive to light.   Neck: Normal range of motion.  Musculoskeletal: Normal range of motion.  Neurological: She is alert and oriented to person, place, and time.    Depressive Symptoms: Patient denies any current depressive symptoms  (Hypo) Manic Symptoms:   Elevated Mood:  No Irritable Mood:  Yes Grandiosity:  No Distractibility:  No Labiality of Mood:  Yes Delusions:  No Hallucinations:  No Impulsivity:  Yes Sexually Inappropriate Behavior:  No Financial Extravagance:  No Flight of Ideas:  No  Anxiety Symptoms: Excessive Worry:  No Panic Symptoms:  No Agoraphobia:  No Obsessive Compulsive: No  Symptoms: None, Specific Phobias:  No Social Anxiety:  Yes  Psychotic Symptoms:  Hallucinations: No None Delusions:  Yes Paranoia:  Yes   Ideas of Reference:  No  PTSD Symptoms: Ever had a traumatic exposure:  No  Traumatic Brain Injury: No   Past Psychiatric History: Diagnosis: Maj. depressive disorder, borderline personality disorder   Hospitalizations: [facility]. September 2013   Outpatient Care: Wallie Char, therapist previously prescribed medications by [facility]   Substance Abuse Care: None   Self-Mutilation: None   Suicidal Attempts: Gestures only   Violent Behaviors: Denies    Past Medical History:   Past Medical History  Diagnosis Date  . Depression   . Epilepsy     last seizure age 18  . Migraine    History of Loss of Consciousness:  No Seizure History:  Yes Cardiac History:  No Allergies:  No Known Allergies Current Medications:  Current Outpatient Prescriptions  Medication Sig Dispense Refill  . citalopram (CELEXA) 20 MG tablet Take 1.5 tablets (30 mg total) by mouth daily. For depression.  45 tablet  0  . ferrous sulfate 325 (65 FE) MG tablet Take 1 tablet (325 mg total) by mouth daily  with breakfast. For iron replacement.  30 tablet  0  . risperiDONE (RISPERDAL) 0.25 MG tablet Take by mouth ONE twice a day and TWO at bed time.  120 tablet  0   No current  facility-administered medications for this visit.    Previous Psychotropic Medications:  Medication Dose   Celexa   30 mg daily   Risperdal   0.25 mg twice a day, 0.5 mg qhs                  Substance Abuse History in the last 12 months: Patient denies any history of substance abuse.   Social History: Axel was born in [place], [place], and grew up in [place], [place]. She is an only child. She reports that she had a "rough childhood." She did not connect well with people, and had very few friends. She reports that she was teased and tolerated an elementary school. She denies any physical, emotional, or sexual abuse. She is currently a Public librarian at [employer], and she works part-time at [employer]. She is currently living with her mother and father in [place]. She denies any legal problems she affiliates as Personal assistant. She reports that her social support system consists of her mom, dad, on, and therapist.  Family History:   Family History  Problem Relation Age of Onset  . Diabetes Mother   . Hypertension Father     Mental Status Examination/Evaluation: Objective:  Appearance: Casual  Eye Contact::  Good  Speech:  Clear and Coherent  Volume:  Normal  Mood:  Euthymic  Affect:  Appropriate  Thought Process:  Linear  Orientation:  Full (Time, Place, and Person)  Thought Content:  WDL  Suicidal Thoughts:  No  Homicidal Thoughts:  No  Judgement:  Fair  Insight:  Fair  Psychomotor Activity:  Normal  Akathisia:  No  Handed:    AIMS (if indicated):    Assets:  Communication Skills Desire for Improvement Social Support    Laboratory/X-Ray Psychological Evaluation(s)        Assessment:    AXIS I Mood Disorder NOS and Rule out bipolar disorder  AXIS II Deferred  AXIS III Past Medical History  Diagnosis Date  . Depression   . Epilepsy     last seizure age 28  . Migraine      AXIS IV problems  related to social environment and problems with primary support group  AXIS V 51-60 moderate symptoms   Treatment Plan/Recommendations:  Plan of Care: We will resume her Risperdal as previously prescribed, and continue her Celexa 30 mg daily.  Laboratory:    Psychotherapy: Continue with Wallie Char  Medications: Risperdal 0.25 mg twice a day and 0.5 mg each bedtime, Celexa 30 mg daily  Routine PRN Medications:  No  Consultations: None  Safety Concerns:  History of suicidal ideation  Other:      Duc Crocket, PA-C 4/9/20144:13 PM

## 2013-03-02 ENCOUNTER — Other Ambulatory Visit (HOSPITAL_COMMUNITY): Payer: Self-pay | Admitting: Physician Assistant

## 2013-03-10 ENCOUNTER — Ambulatory Visit (HOSPITAL_COMMUNITY): Payer: Self-pay | Admitting: Physician Assistant

## 2013-04-25 ENCOUNTER — Other Ambulatory Visit (HOSPITAL_COMMUNITY): Payer: Self-pay | Admitting: Physician Assistant

## 2013-04-27 ENCOUNTER — Ambulatory Visit (HOSPITAL_COMMUNITY): Payer: Self-pay | Admitting: Physician Assistant

## 2013-05-21 ENCOUNTER — Ambulatory Visit (INDEPENDENT_AMBULATORY_CARE_PROVIDER_SITE_OTHER): Payer: PRIVATE HEALTH INSURANCE | Admitting: Physician Assistant

## 2013-05-21 VITALS — BP 118/75 | HR 72 | Ht 61.0 in | Wt 179.0 lb

## 2013-05-21 DIAGNOSIS — F39 Unspecified mood [affective] disorder: Secondary | ICD-10-CM

## 2013-05-21 MED ORDER — CITALOPRAM HYDROBROMIDE 20 MG PO TABS: ORAL_TABLET | ORAL | Status: DC

## 2013-05-22 NOTE — Progress Notes (Signed)
   [facility] Behavioral Health Follow-up Outpatient Visit  SHAHARA HARTSFIELD [birth year]  Date: 05/21/2013   Subjective: Brianna Cordova presents today to followup on her treatment for her mood disorder. She reports that she is not taking any summer classes, and she is working to earn money for next fall. She does not like being out of school, as she likes the regular schedule of classes. In the fall she will begin taking classes in her major. She reports that she has some mood swings during her menstrual period. She endorses good sleep. Her appetite is good, and she is eating healthy. She reports that she is exercising 3 times a week at the gym. She denies any suicidal or homicidal ideation he denies any auditory or visual hallucinations.  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 We will continue her Risperdal 0.25 milligrams twice daily and 0.5 mg at bedtime. She will return for followup in 3 months.   Demarea Lorey, PA-C

## 2013-07-14 ENCOUNTER — Other Ambulatory Visit (HOSPITAL_COMMUNITY): Payer: Self-pay | Admitting: Physician Assistant

## 2013-07-14 DIAGNOSIS — F39 Unspecified mood [affective] disorder: Secondary | ICD-10-CM

## 2013-08-19 ENCOUNTER — Encounter (HOSPITAL_COMMUNITY): Payer: Self-pay | Admitting: Emergency Medicine

## 2013-08-19 ENCOUNTER — Emergency Department (INDEPENDENT_AMBULATORY_CARE_PROVIDER_SITE_OTHER)
Admission: EM | Admit: 2013-08-19 | Discharge: 2013-08-19 | Disposition: A | Discharge status: Home / Self Care | Payer: PRIVATE HEALTH INSURANCE | Source: Home / Self Care

## 2013-08-19 DIAGNOSIS — J040 Acute laryngitis: Secondary | ICD-10-CM

## 2013-08-19 DIAGNOSIS — J069 Acute upper respiratory infection, unspecified: Secondary | ICD-10-CM

## 2013-08-19 DIAGNOSIS — R0982 Postnasal drip: Secondary | ICD-10-CM

## 2013-08-19 LAB — POCT RAPID STREP A
Streptococcus, Group A Screen (Direct): NEGATIVE
Streptococcus, Group A Screen (Direct): NEGATIVE

## 2013-08-19 MED ORDER — PREDNISONE 20 MG PO TABS: ORAL_TABLET | ORAL | Status: DC

## 2013-08-19 MED ORDER — PHENYLEPHRINE-CHLORPHEN-DM 10-4-12.5 MG/5ML PO LIQD: 5.0000 mL | ORAL | Status: DC | PRN

## 2013-08-19 NOTE — ED Provider Notes (Signed)
Medical screening examination/treatment/procedure(s) were performed by non-physician practitioner and as supervising physician I was immediately available for consultation/collaboration.  Leslee Home, M.D.  Reuben Likes, MD 08/19/13 [phone]

## 2013-08-19 NOTE — ED Provider Notes (Addendum)
CSN: [id number]     Arrival date & time 08/19/13  1128 History   First MD Initiated Contact with Patient 08/19/13 1229     Chief Complaint  Patient presents with  . Sore Throat    since sunday. low grade temp   (Consider location/radiation/quality/duration/timing/severity/associated sxs/prior Treatment) HPI Comments: 22 year old female is accompanied by an older significant other complaining of sore throat for 3 days. More recently having nasal congestion, PND and low-grade TM of 99.1. She is unable or unwilling to make a sound. She refuses to talk but will turn her head yes or no. When asked about it she is having a sore throat now she is moves her hand in such a way that it is just a little bit, off and on.  Patient is a 22 y.o. female presenting with pharyngitis.  Sore Throat Pertinent negatives include no shortness of breath.    Past Medical History  Diagnosis Date  . Depression   . Epilepsy     last seizure age 53  . Migraine    Past Surgical History  Procedure Laterality Date  . Wisdom tooth extraction  Age 63   Family History  Problem Relation Age of Onset  . Diabetes Mother   . Hypertension Father    History  Substance Use Topics  . Smoking status: Never Smoker   . Smokeless tobacco: Never Used  . Alcohol Use: No   OB History   Grav Para Term Preterm Abortions TAB SAB Ect Mult Living                 Review of Systems  Constitutional: Positive for fever. Negative for chills, activity change, appetite change and fatigue.  HENT: Positive for congestion, postnasal drip, rhinorrhea, sore throat and voice change. Negative for facial swelling, sinus pressure and trouble swallowing.   Eyes: Negative.   Respiratory: Negative.  Negative for cough and shortness of breath.   Cardiovascular: Negative.   Gastrointestinal: Negative.   Musculoskeletal: Negative for neck pain and neck stiffness.  Skin: Negative for pallor and rash.  Neurological: Negative.     Allergies   Review of patient's allergies indicates no known allergies.  Home Medications   Current Outpatient Rx  Name  Route  Sig  Dispense  Refill  . citalopram (CELEXA) 20 MG tablet      Take 1.5 tablets daily, and 2 tablets daily for five days during menses.   50 tablet   2   . risperiDONE (RISPERDAL) 0.25 MG tablet      TAKE ONE TABLET BY MOUTH TWICE A DAY AND TWO AT BED TIME.   120 tablet   0   . ferrous sulfate 325 (65 FE) MG tablet   Oral   Take 1 tablet (325 mg total) by mouth daily with breakfast. For iron replacement.   30 tablet   0   . Phenylephrine-Chlorphen-DM 08-16-11.5 MG/5ML LIQD   Oral   Take 5 mLs by mouth every 4 (four) hours as needed.   120 mL   0   . predniSONE (DELTASONE) 20 MG tablet      2 tabs po once daily x 3 days, 1 tab daily x 2 days   8 tablet   0    BP 117/78  Pulse 76  Temp(Src) 98.9 F (37.2 C) (Oral)  Resp 20  SpO2 97%  LMP 08/19/2013 Physical Exam  Nursing note and vitals reviewed. Constitutional: She is oriented to person, place, and time. She appears well-developed and  well-nourished. No distress.  HENT:  Mouth/Throat: No oropharyngeal exudate.  Bilateral TMs are normal Oropharynx with minor erythema, cobblestoning and clear PND. No tonsil enlargement or swelling. Airway is widely patent. No stridor.  Eyes: Conjunctivae and EOM are normal.  Neck: Normal range of motion. Neck supple.  Cardiovascular: Normal rate, regular rhythm and normal heart sounds.   Pulmonary/Chest: Effort normal and breath sounds normal. No respiratory distress. She has no wheezes. She has no rales.  Musculoskeletal: Normal range of motion. She exhibits no edema.  Lymphadenopathy:    She has no cervical adenopathy.  Neurological: She is alert and oriented to person, place, and time.  Skin: Skin is warm and dry. No rash noted.  Psychiatric: She has a normal mood and affect.    ED Course  Procedures (including critical care time) Labs Review Labs  Reviewed  CULTURE, GROUP A STREP  POCT RAPID STREP A ([facility] CARE ONLY)  POCT RAPID STREP A ([facility] CARE ONLY)   Imaging Review No results found.  MDM   1. URI (upper respiratory infection)   2. PND (post-nasal drip)   3. Laryngitis      Prednisone as directed Norell CS as directed Drink plenty of fluids stay well hydrated May take Tylenol for discomfort and fever No work for 3 days. Followup your primary care doctor as needed.  Hayden Rasmussen, NP 08/19/13 1324  08/22/13 Amoxil 500 tid   E-scribed to pharmacy DMabe, NP  Hayden Rasmussen, NP 08/22/13 1503

## 2013-08-19 NOTE — ED Notes (Signed)
C/o sore throat and low grade temp since Sunday. And dizziness on Sunday.   Pt has used salt water gargles, thera flu, and Listerine with no relief in symptoms.  Denies n/v/d.

## 2013-08-21 LAB — CULTURE, GROUP A STREP

## 2013-08-22 ENCOUNTER — Telehealth (HOSPITAL_COMMUNITY): Payer: Self-pay | Admitting: *Deleted

## 2013-08-22 MED ORDER — AMOXICILLIN 500 MG PO CAPS: 500.0000 mg | ORAL_CAPSULE | Freq: Three times a day (TID) | ORAL | Status: DC

## 2013-08-22 NOTE — ED Provider Notes (Signed)
Medical screening examination/treatment/procedure(s) were performed by non-physician practitioner and as supervising physician I was immediately available for consultation/collaboration.  Leslee Home, M.D.   Reuben Likes, MD 08/22/13 [phone]

## 2013-08-22 NOTE — ED Notes (Addendum)
Throat culture: Strep beta hemolytic not group A.  10/10  Message to Brianna Rasmussen NP.  He e-prescribed Amoxicillin to pt.'s pharmacy.  10/11 I called pt. and left a message to call. Brianna Cordova 08/22/2013 Pt. called back.  Pt. verified x 2 and given result.  Pt. told she needs Amoxicillin for Strep infection.  Pt. told the Rx. was sent to the [facility] on [address].  Pt. instructed to notify anyone she exposed to get checked for strep if they get the same symptoms and to drink plenty of liquids. Pt. voiced understanding.

## 2013-08-24 ENCOUNTER — Encounter (HOSPITAL_COMMUNITY): Payer: Self-pay | Admitting: Physician Assistant

## 2013-08-24 ENCOUNTER — Ambulatory Visit (INDEPENDENT_AMBULATORY_CARE_PROVIDER_SITE_OTHER): Payer: PRIVATE HEALTH INSURANCE | Admitting: Physician Assistant

## 2013-08-24 VITALS — BP 120/80 | HR 84 | Ht 61.42 in | Wt 182.8 lb

## 2013-08-24 DIAGNOSIS — F39 Unspecified mood [affective] disorder: Secondary | ICD-10-CM

## 2013-08-24 DIAGNOSIS — N943 Premenstrual tension syndrome: Secondary | ICD-10-CM

## 2013-08-24 MED ORDER — RISPERIDONE 0.25 MG PO TABS: ORAL_TABLET | ORAL | Status: DC

## 2013-08-24 MED ORDER — CITALOPRAM HYDROBROMIDE 20 MG PO TABS: ORAL_TABLET | ORAL | Status: DC

## 2013-08-24 NOTE — Progress Notes (Signed)
   [facility] Behavioral Health Follow-up Outpatient Visit  DANNON PERLOW [birth year]  Date: 08/24/2013   Subjective:  Brianna Cordova presents today to followup on her treatment for her mood disorder. She reports that she has recently had an upper respiratory infection, and presented to the emergency department last week. She was diagnosed with pharyngitis and discharged on amoxicillin 500 mg 3 times daily for one week. She reports that during her illness she her sleep had increased and her appetite had decreased. She is currently not in school as she did not realize she had to reapply for admission. She plans to resume her studies in January. 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 She endorses that her mood has been stable. She denies any suicidal or homicidal ideation. She denies any auditory or visual hallucinations.  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 We'll also continue her Risperdal 0.25 mg twice daily and 0.5 mg at bedtime. She will return for followup in 3 months.  Amairani Shuey, PA-C

## 2013-09-01 ENCOUNTER — Other Ambulatory Visit (HOSPITAL_COMMUNITY): Payer: Self-pay | Admitting: Physician Assistant

## 2013-11-24 ENCOUNTER — Ambulatory Visit (HOSPITAL_COMMUNITY): Payer: Self-pay | Admitting: Psychiatry

## 2014-02-03 ENCOUNTER — Ambulatory Visit (HOSPITAL_COMMUNITY): Payer: Self-pay | Admitting: Psychiatry

## 2014-02-05 ENCOUNTER — Ambulatory Visit (INDEPENDENT_AMBULATORY_CARE_PROVIDER_SITE_OTHER): Payer: PRIVATE HEALTH INSURANCE | Admitting: Psychiatry

## 2014-02-05 ENCOUNTER — Encounter (HOSPITAL_COMMUNITY): Payer: Self-pay | Admitting: Psychiatry

## 2014-02-05 VITALS — Wt 184.0 lb

## 2014-02-05 DIAGNOSIS — F319 Bipolar disorder, unspecified: Secondary | ICD-10-CM

## 2014-02-05 DIAGNOSIS — F39 Unspecified mood [affective] disorder: Secondary | ICD-10-CM

## 2014-02-05 DIAGNOSIS — Z79899 Other long term (current) drug therapy: Secondary | ICD-10-CM

## 2014-02-05 MED ORDER — CITALOPRAM HYDROBROMIDE 20 MG PO TABS: ORAL_TABLET | ORAL | Status: DC

## 2014-02-05 MED ORDER — RISPERIDONE 0.25 MG PO TABS: ORAL_TABLET | ORAL | Status: DC

## 2014-02-05 NOTE — Progress Notes (Signed)
[facility] [id number] Progress Note  Brianna Cordova [id number] 22 y.o.  02/05/2014 12:04 PM  Chief Complaint:  I need my medication.  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  She has been seen by physician assistant in this office since 2013.  Her previous provider has left her practice.  The patient has history of mood swings anger and irritability.  She is taking Risperdal 0.25 milligrams twice a day and 2 at bedtime.  She is also taking Celexa 30 mg daily.  The patient endorsed much improvement in her irritability anger mood swing and paranoia since she is taking these medication.  However she is concerned about the weight gain.  She has gained weight in the past 2 years. She is also seeing therapist Gunnar Fusiaula for counseling.  She denies any recent agitation or any suicidal thoughts.  She still has some time racing thoughts and insomnia but overall there has been no aggression or violence.  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  She is also working 20 hours at [employer].  She is living with her parents.  She denies any tremors or shakes.  She is not drinking or using any illegal substances.  Suicidal Ideation: No Plan Formed: No Patient has means to carry out plan: No  Homicidal Ideation: No Plan Formed: No Patient has means to carry out plan: No  Medical History; Patient has history of epilepsy however she's not taking any medication.  [facility].  She has not seen her primary care physician in a while.  She also has migraine.  Family History; Patient denies any family history of psychiatric illness.  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  Psychosocial History; Patient lives with her parents.  She has no children.  Past Psychiatric  History/Hospitalization(s) [facility]  She was admitted because of suicidal thinking, she wanted to drove her car into something.  Patient has history of depression, mood swings, anger since childhood.  She also had tried to cut her wrist in the past.  She was diagnosed with major depressive disorder, bipolar disorder and borderline personality disorder.  She also endorsed history of hallucination Anxiety: No Bipolar Disorder: Yes Depression: Yes Mania: Yes Psychosis: Yes Schizophrenia: No Personality Disorder: Cluster B. traits Hospitalization for psychiatric illness: No History of Electroconvulsive Shock Therapy: No Prior Suicide Attempts: Yes   Review of Systems: Psychiatric: Agitation: No Hallucination: No Depressed Mood: No Insomnia: No Hypersomnia: No Altered Concentration: No Feels Worthless: No Grandiose Ideas: No Belief In Special Powers: No New/Increased Substance Abuse: No Compulsions: No  Neurologic: Headache: Yes Seizure: History of seizures but not taking any medication Paresthesias: No    Outpatient Encounter Prescriptions as of 02/05/2014  Medication Sig  . citalopram (CELEXA) 20 MG tablet Take 1.5 tablets daily  . ferrous sulfate 325 (65 FE) MG tablet Take 1 tablet (325 mg total) by mouth daily with breakfast. For iron replacement.  . risperiDONE (RISPERDAL) 0.25 MG tablet TAKE ONE TABLET BY MOUTH TWICE A DAY AND TWO AT BED TIME.  . [DISCONTINUED] citalopram (CELEXA) 20 MG tablet Take 1.5 tablets daily, and 2 tablets daily for five days during menses.  . [DISCONTINUED] risperiDONE (RISPERDAL) 0.25 MG tablet TAKE ONE TABLET BY MOUTH TWICE A DAY  AND TWO AT BED TIME.  . [DISCONTINUED] amoxicillin (AMOXIL) 500 MG capsule Take 1 capsule (500 mg total) by mouth 3 (three) times daily.  . [DISCONTINUED] Phenylephrine-Chlorphen-DM 08-16-11.5 MG/5ML LIQD Take 5 mLs by mouth every 4 (four) hours as needed.  .  [DISCONTINUED] predniSONE (DELTASONE) 20 MG tablet 2 tabs po once daily x 3 days, 1 tab daily x 2 days    No results found for this or any previous visit (from the past 2160 hour(s)).    Physical Exam: Constitutional:  Wt 184 lb (83.462 kg)  Musculoskeletal: Strength & Muscle Tone: within normal limits Gait & Station: normal Patient leans: N/A  Mental Status Examination;  Patient is obese female who is casually dressed and fairly groomed.  She maintains fair eye contact.  Her speech is fast and rapid but coherent.  Her thought processes also fast but logical.  She denies any auditory or visual hallucination.  She denies any active or passive suicidal thoughts or homicidal thoughts.  There were no delusions or any paranoia.  Her fund of knowledge is adequate.  Her psychomotor activity is slightly increased.  There were no tremors or shakes.  Hypertension concentration is fair.  Her memory is intact.  She is alert and oriented x3.  Her insight judgment and impulse control is okay.   Established Problem, Stable/Improving (1), New problem, with additional work up planned, Decision to obtain old records (1), Review and summation of old records (2), Review of Last Therapy Session (1) and Review of Medication Regimen & Side Effects (2)  Assessment: Axis I: Bipolar disorder NOS,  Axis II: Cluster B. traits  Axis III:  Past Medical History  Diagnosis Date  . Depression   . Epilepsy     last seizure age 36  . Migraine     Axis IV: Mild   Plan:  I reviewed her history, collateral information, current medication and her current psychosocial stressors.  Patient is fairly stable on her current psychotropic medication.  However she is concerned about weight gain.  Patient told that diabetes runs in her family.  Recommended exercise and watching her calorie intake.  We will do blood work including CBC, CMP hemoglobin A1c.  At this time she does not have any side effects including any tremors  however we will consider lowering the medication in the future if she is unable to lose weight by watching her calorie intake and doing regular exercise.  Discussed in detail the risks and benefits of medication and recommended to call us back if she has any question or any concern.  I will see her again in 6 weeks.Time spent 25 minutes.  More than 50% of the time spent in psychoeducation, counseling and coordination of care.  Discuss safety plan that anytime having active suicidal thoughts or homicidal thoughts then patient need to call 911 or go to the local emergency room.    Alieah Brinton T., MD 02/05/2014

## 2014-03-23 ENCOUNTER — Encounter (HOSPITAL_COMMUNITY): Payer: Self-pay | Admitting: Psychiatry

## 2014-03-23 ENCOUNTER — Ambulatory Visit (INDEPENDENT_AMBULATORY_CARE_PROVIDER_SITE_OTHER): Payer: PRIVATE HEALTH INSURANCE | Admitting: Psychiatry

## 2014-03-23 VITALS — BP 122/71 | HR 67 | Ht 61.0 in | Wt 190.0 lb

## 2014-03-23 DIAGNOSIS — F39 Unspecified mood [affective] disorder: Secondary | ICD-10-CM

## 2014-03-23 DIAGNOSIS — F319 Bipolar disorder, unspecified: Secondary | ICD-10-CM

## 2014-03-23 MED ORDER — RISPERIDONE 1 MG PO TABS: 1.0000 mg | ORAL_TABLET | Freq: Every day | ORAL | Status: DC

## 2014-03-23 MED ORDER — CITALOPRAM HYDROBROMIDE 20 MG PO TABS: ORAL_TABLET | ORAL | Status: DC

## 2014-03-23 NOTE — Progress Notes (Signed)
[facility] [id number] Progress Note  Brianna Cordova [id number] 23 y.o.  03/23/2014 9:51 AM  Chief Complaint:  Medication management and followup.  History of Present Illness:  Brianna Cordova came for her followup appointment.   She is compliant with her psychotropic medication.  She reported sometimes poor sleep and racing thoughts at bedtime.  However her mood irritability and anger is much improved with the medication.  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  She is trying to find have a job and some rotation.  She denies any tremors or shakes.  She has blood work at her primary care physician and she was told that she has no diabetes.  However we do not have results available.  She continues to work 20 hours at [employer].  She is living with her parents.  She is a [employer]ing [employer] .  She has a major in biotech.  She denies any tremors or shakes.  She is living with her parents.  She is not drinking or using any illegal substances.  Her vitals are stable.  Suicidal Ideation: No Plan Formed: No Patient has means to carry out plan: No  Homicidal Ideation: No Plan Formed: No Patient has means to carry out plan: No  Medical History; Her primary care physician is Dr. Andi DevonKimberly Shelton .  Patient has history of epilepsy and migraine however she's not taking any medication.    Past Psychiatric History/Hospitalization(s) [facility]  She was admitted because of suicidal thinking, she wanted to drove her car into something.  Patient has history of depression, mood swings, anger since childhood.  She also had tried to cut her wrist in the past.  She was diagnosed with major depressive disorder, bipolar disorder and borderline personality disorder.  She also endorsed history of hallucination Anxiety: No Bipolar Disorder: Yes Depression: Yes Mania: Yes Psychosis: Yes Schizophrenia: No Personality Disorder: Cluster B. traits Hospitalization  for psychiatric illness: No History of Electroconvulsive Shock Therapy: No Prior Suicide Attempts: Yes   Review of Systems: Psychiatric: Agitation: No Hallucination: No Depressed Mood: No Insomnia: No Hypersomnia: No Altered Concentration: No Feels Worthless: No Grandiose Ideas: No Belief In Special Powers: No New/Increased Substance Abuse: No Compulsions: No  Neurologic: Headache: Yes Seizure: History of seizures but not taking any medication Paresthesias: No    Outpatient Encounter Prescriptions as of 03/23/2014  Medication Sig  . citalopram (CELEXA) 20 MG tablet Take 1.5 tablets daily  . ferrous sulfate 325 (65 FE) MG tablet Take 1 tablet (325 mg total) by mouth daily with breakfast. For iron replacement.  . risperiDONE (RISPERDAL) 1 MG tablet Take 1 tablet (1 mg total) by mouth at bedtime.  . [DISCONTINUED] citalopram (CELEXA) 20 MG tablet Take 1.5 tablets daily  . [DISCONTINUED] risperiDONE (RISPERDAL) 0.25 MG tablet TAKE ONE TABLET BY MOUTH TWICE A DAY AND TWO AT BED TIME.    No results found for this or any previous visit (from the past 2160 hour(s)).    Physical Exam: Constitutional:  BP 122/71  Pulse 67  Ht 5\' 1"  (1.549 m)  Wt 190 lb (86.183 kg)  BMI 35.92 kg/m2  Musculoskeletal: Strength & Muscle Tone: within normal limits Gait & Station: normal Patient leans: N/A  Mental Status Examination;  Patient is obese female who is casually dressed and fairly groomed.  She maintains fair eye contact.  Her speech is fast but coherent.  Her thought processes is logical.  She denies any auditory  or visual hallucination.  She denies any active or passive suicidal thoughts or homicidal thoughts.  There were no delusions or any paranoia.  Her fund of knowledge is adequate.  Her psychomotor activity is slightly increased.  There were no tremors or shakes.  Hypertension concentration is fair.  Her memory is intact.  She is alert and oriented x3.  Her insight judgment and  impulse control is okay.   Established Problem, Stable/Improving (1), Review of Last Therapy Session (1) and Review of Medication Regimen & Side Effects (2)  Assessment: Axis I: Bipolar disorder NOS,  Axis II: Cluster B. traits  Axis III:  Past Medical History  Diagnosis Date  . Depression   . Epilepsy     last seizure age 23  . Migraine     Axis IV: Mild   Plan:  I recommended to take all risperidone at bedtime to help with sleep and racing thoughts.  I also recommended to have her blood work results faxed to suspicion has a copy of the results. Discussed in detail the risks and benefits of medication and recommended to call us back if she has any question or any concern.  I will see her again in 12 weeks.   Lesslie Mossa T., MD 03/23/2014

## 2014-06-23 ENCOUNTER — Ambulatory Visit (HOSPITAL_COMMUNITY): Payer: Self-pay | Admitting: Psychiatry

## 2014-07-01 ENCOUNTER — Ambulatory Visit (INDEPENDENT_AMBULATORY_CARE_PROVIDER_SITE_OTHER): Payer: PRIVATE HEALTH INSURANCE | Admitting: Psychiatry

## 2014-07-01 ENCOUNTER — Encounter (HOSPITAL_COMMUNITY): Payer: Self-pay | Admitting: Psychiatry

## 2014-07-01 VITALS — BP 120/76 | HR 64 | Ht 61.0 in | Wt 183.0 lb

## 2014-07-01 DIAGNOSIS — F39 Unspecified mood [affective] disorder: Secondary | ICD-10-CM

## 2014-07-01 DIAGNOSIS — F319 Bipolar disorder, unspecified: Secondary | ICD-10-CM

## 2014-07-01 DIAGNOSIS — R45851 Suicidal ideations: Secondary | ICD-10-CM

## 2014-07-01 MED ORDER — RISPERIDONE 1 MG PO TABS: 1.0000 mg | ORAL_TABLET | Freq: Every day | ORAL | Status: DC

## 2014-07-01 MED ORDER — CITALOPRAM HYDROBROMIDE 20 MG PO TABS
ORAL_TABLET | ORAL | Status: DC
Start: 2014-07-01 — End: 2014-07-04

## 2014-07-01 NOTE — Progress Notes (Signed)
[facility] [id number] Progress Note  Brianna Cordova [id number] 23 y.o.  07/01/2014 3:53 PM  Chief Complaint:  I am feeling more depressed and sad.  I cannot sleep.    History of Present Illness:  Brianna Cordova came for her followup appointment.   She had missed appointment last week.  She is hopeful for medication.  She's been experiencing increased irritability, anger, crying spells and lack of sleep.  She is ready tearful and having crying spells.  She mentioned she is out of medication and she has difficulty falling asleep.  She endorse not getting along with her mother .  She endorse some time paranoia and hallucination.  She was thinking to cut herself last night however she was able to stop.  She also endorsed sometime feeling hopeless helpless and worthless.  She endorse passive and fleeting suicidal thoughts but no plan.  She is able to contract for safety.  She has noticed more irritable and agitated.  She recently switched her job and she is working in a company name "[employer]" before past 3 weeks.  She likes her job because it is keeping her busy but she gets sometimes overwhelmed.  She is going to school.  She endorse that relationship with the boyfriend is not going very well.  She believed that he does not love her anymore and maybe involved with someone else.  She did not recall any side effects of medication.  Her appetite is fair.  She has lost weight from the past.  She endorsed sometime not eating all day.  She denies any drinking or using any illegal substances.  She lives with her mother.  She has a major in biotech.  The patient recently seen her primary care physician and she had a blood work done.  Her hemoglobin A1c is 5.7.  CBC is normal.  Her basic chemistry is also normal.  Her LDL is 102, hypercholesterol is 189.  Her BUN and creatinine is normal.   Suicidal Ideation: Passive and fleeting thoughts but no plan Plan Formed: No Patient has means to carry out plan:  No  Homicidal Ideation: No Plan Formed: No Patient has means to carry out plan: No  Medical History; Her primary care physician is Dr. Andi Devon .  Patient has history of epilepsy and migraine however she's not taking any medication.    Past Psychiatric History/Hospitalization(s) [facility]  She was admitted because of suicidal thinking, she wanted to drove her car into something.  Patient has history of depression, mood swings, anger since childhood.  She has history of cutting her wrists in the past.  She was diagnosed with major depressive disorder, bipolar disorder and borderline personality disorder.  She also endorsed history of hallucination Anxiety: No Bipolar Disorder: Yes Depression: Yes Mania: Yes Psychosis: Yes Schizophrenia: No Personality Disorder: Cluster B. traits Hospitalization for psychiatric illness: No History of Electroconvulsive Shock Therapy: No Prior Suicide Attempts: Yes   Review of Systems: Psychiatric: Agitation: Yes Hallucination: No Depressed Mood: Yes Insomnia: No Hypersomnia: No Altered Concentration: No Feels Worthless: Yes Grandiose Ideas: No Belief In Special Powers: No New/Increased Substance Abuse: No Compulsions: No  Neurologic: Headache: Yes Seizure: History of seizures but not taking any medication Paresthesias: No    Outpatient Encounter Prescriptions as of 07/01/2014  Medication Sig  . citalopram (CELEXA) 20 MG tablet Take 1.5 tablets daily  . ferrous sulfate 325 (65 FE) MG tablet Take 1 tablet (325 mg total)  by mouth daily with breakfast. For iron replacement.  . risperiDONE (RISPERDAL) 1 MG tablet Take 1 tablet (1 mg total) by mouth at bedtime.  . [DISCONTINUED] citalopram (CELEXA) 20 MG tablet Take 1.5 tablets daily  . [DISCONTINUED] risperiDONE (RISPERDAL) 1 MG tablet Take 1 tablet (1 mg total) by mouth at bedtime.    No results found for this or any previous visit  (from the past 2160 hour(s)).    Physical Exam: Constitutional:  BP 120/76  Pulse 64  Ht 5\' 1"  (1.549 m)  Wt 183 lb (83.008 kg)  BMI 34.60 kg/m2  Musculoskeletal: Strength & Muscle Tone: within normal limits Gait & Station: normal Patient leans: N/A  Mental Status Examination;  Patient is obese female who is casually dressed and fairly groomed.  She is tearful and very anxious.  She described her mood as sad depressed and her affect is constricted.  She endorse passive and fleeting suicidal thoughts however no plan or any intent.  She endorse some time paranoia and hallucination however there were no delusions or any obsessive thoughts.  She maintained fair eye contact.  Her attention and concentration is fair.  Her speech is slow , clear and coherent.  Her thought processes is logical.  Her psychomotor activity is decreased.  Her fund of knowledge is adequate.  There were no tremors or shakes.  Hypertension concentration is fair.  Her memory is intact.  She is alert and oriented x3.  Her insight judgment and impulse control is okay.   Established Problem, Stable/Improving (1), Review and summation of old records (2), Established Problem, Worsening (2), Review of Last Therapy Session (1) and Review of Medication Regimen & Side Effects (2)  Assessment: Axis I: Bipolar disorder NOS,  Axis II: Cluster B. traits  Axis III:  Past Medical History  Diagnosis Date  . Depression   . Epilepsy     last seizure age 23  . Migraine     Axis IV: Mild   Plan:  The patient is slowly decompensating which could be due to noncompliance with medication.  Reinforce to start medication as soon as possible.  I also reviewed her blood work results.  Patient is able to contract for safety.  Recommended to restart Celexa 20 mg 1-1/2 tablet daily and Risperdal 1 mg at bedtime.  Patient does not recall any side effects of medication.  Discussed risks and benefits especially noncompliance with medication  may cause a relapse into her illness.  I will see her again in 2 weeks. Time spent 25 minutes.  More than 50% of the time spent in psychoeducation, counseling and coordination of care.  Discuss safety plan that anytime having active suicidal thoughts or homicidal thoughts then patient need to call 911 or go to the local emergency room.   Chyrl Elwell T., MD 07/01/2014

## 2014-07-04 ENCOUNTER — Emergency Department (HOSPITAL_COMMUNITY): Payer: No Typology Code available for payment source

## 2014-07-04 ENCOUNTER — Encounter (HOSPITAL_COMMUNITY): Payer: Self-pay | Admitting: Emergency Medicine

## 2014-07-04 ENCOUNTER — Emergency Department (HOSPITAL_COMMUNITY)
Admission: EM | Admit: 2014-07-04 | Discharge: 2014-07-04 | Disposition: A | Discharge status: Home / Self Care | Payer: No Typology Code available for payment source | Attending: Emergency Medicine | Admitting: Emergency Medicine

## 2014-07-04 DIAGNOSIS — R011 Cardiac murmur, unspecified: Secondary | ICD-10-CM | POA: Diagnosis not present

## 2014-07-04 DIAGNOSIS — Z8669 Personal history of other diseases of the nervous system and sense organs: Secondary | ICD-10-CM | POA: Diagnosis not present

## 2014-07-04 DIAGNOSIS — R319 Hematuria, unspecified: Secondary | ICD-10-CM | POA: Diagnosis present

## 2014-07-04 DIAGNOSIS — F329 Major depressive disorder, single episode, unspecified: Secondary | ICD-10-CM | POA: Insufficient documentation

## 2014-07-04 DIAGNOSIS — A599 Trichomoniasis, unspecified: Secondary | ICD-10-CM | POA: Diagnosis not present

## 2014-07-04 DIAGNOSIS — R110 Nausea: Secondary | ICD-10-CM | POA: Insufficient documentation

## 2014-07-04 DIAGNOSIS — N200 Calculus of kidney: Secondary | ICD-10-CM | POA: Insufficient documentation

## 2014-07-04 DIAGNOSIS — Z3202 Encounter for pregnancy test, result negative: Secondary | ICD-10-CM | POA: Insufficient documentation

## 2014-07-04 DIAGNOSIS — Z8679 Personal history of other diseases of the circulatory system: Secondary | ICD-10-CM | POA: Insufficient documentation

## 2014-07-04 DIAGNOSIS — Z79899 Other long term (current) drug therapy: Secondary | ICD-10-CM | POA: Diagnosis not present

## 2014-07-04 DIAGNOSIS — F3289 Other specified depressive episodes: Secondary | ICD-10-CM | POA: Diagnosis not present

## 2014-07-04 LAB — WET PREP, GENITAL
CLUE CELLS WET PREP: NONE SEEN
WBC, Wet Prep HPF POC: NONE SEEN
YEAST WET PREP: NONE SEEN

## 2014-07-04 LAB — URINALYSIS, ROUTINE W REFLEX MICROSCOPIC
Bilirubin Urine: NEGATIVE
Glucose, UA: NEGATIVE mg/dL
Ketones, ur: NEGATIVE mg/dL
NITRITE: NEGATIVE
PROTEIN: NEGATIVE mg/dL
SPECIFIC GRAVITY, URINE: 1.022 (ref 1.005–1.030)
UROBILINOGEN UA: 0.2 mg/dL (ref 0.0–1.0)
pH: 5.5 (ref 5.0–8.0)

## 2014-07-04 LAB — COMPREHENSIVE METABOLIC PANEL
ALT: 15 U/L (ref 0–35)
AST: 17 U/L (ref 0–37)
Albumin: 4.2 g/dL (ref 3.5–5.2)
Alkaline Phosphatase: 68 U/L (ref 39–117)
Anion gap: 13 (ref 5–15)
BUN: 8 mg/dL (ref 6–23)
CALCIUM: 9.2 mg/dL (ref 8.4–10.5)
CO2: 24 mEq/L (ref 19–32)
CREATININE: 0.87 mg/dL (ref 0.50–1.10)
Chloride: 103 mEq/L (ref 96–112)
GFR calc non Af Amer: >90 mL/min (ref >90)
GLUCOSE: 95 mg/dL (ref 70–99)
Potassium: 4.1 mEq/L (ref 3.7–5.3)
SODIUM: 140 meq/L (ref 137–147)
Total Bilirubin: 0.9 mg/dL (ref 0.3–1.2)
Total Protein: 8 g/dL (ref 6.0–8.3)

## 2014-07-04 LAB — URINE MICROSCOPIC-ADD ON

## 2014-07-04 LAB — CBC WITH DIFFERENTIAL/PLATELET
Basophils Absolute: 0 10*3/uL (ref 0.0–0.1)
Basophils Relative: 0 % (ref 0–1)
EOS ABS: 0.1 10*3/uL (ref 0.0–0.7)
Eosinophils Relative: 1 % (ref 0–5)
HCT: 35.6 % — ABNORMAL LOW (ref 36.0–46.0)
Hemoglobin: 11.8 g/dL — ABNORMAL LOW (ref 12.0–15.0)
Lymphocytes Relative: 30 % (ref 12–46)
Lymphs Abs: 2.1 10*3/uL (ref 0.7–4.0)
MCH: 26.1 pg (ref 26.0–34.0)
MCHC: 33.1 g/dL (ref 30.0–36.0)
MCV: 78.8 fL (ref 78.0–100.0)
Monocytes Absolute: 0.6 10*3/uL (ref 0.1–1.0)
Monocytes Relative: 8 % (ref 3–12)
Neutro Abs: 4.2 10*3/uL (ref 1.7–7.7)
Neutrophils Relative %: 61 % (ref 43–77)
PLATELETS: 235 10*3/uL (ref 150–400)
RBC: 4.52 MIL/uL (ref 3.87–5.11)
RDW: 13 % (ref 11.5–15.5)
WBC: 7 10*3/uL (ref 4.0–10.5)

## 2014-07-04 LAB — PREGNANCY, URINE: PREG TEST UR: NEGATIVE

## 2014-07-04 MED ORDER — METRONIDAZOLE 500 MG PO TABS
2000.0000 mg | ORAL_TABLET | Freq: Once | ORAL | Status: AC
  Administered 2014-07-04: 2000 mg via ORAL
  Filled 2014-07-04: qty 4

## 2014-07-04 MED ORDER — ONDANSETRON HCL 4 MG/2ML IJ SOLN
4.0000 mg | Freq: Once | INTRAMUSCULAR | Status: AC
  Administered 2014-07-04: 4 mg via INTRAVENOUS
  Filled 2014-07-04: qty 2

## 2014-07-04 MED ORDER — MORPHINE SULFATE 2 MG/ML IJ SOLN
2.0000 mg | Freq: Once | INTRAMUSCULAR | Status: AC
  Administered 2014-07-04: 2 mg via INTRAVENOUS
  Filled 2014-07-04: qty 1

## 2014-07-04 MED ORDER — TRAMADOL-ACETAMINOPHEN 37.5-325 MG PO TABS: 1.0000 | ORAL_TABLET | Freq: Four times a day (QID) | ORAL | Status: DC | PRN

## 2014-07-04 NOTE — ED Notes (Signed)
Seh c/o mild dysuria and some hematuria x just over a week.  She is in no distress.

## 2014-07-04 NOTE — Discharge Instructions (Signed)
Take the prescribed medication as directed for pain control. You have been treated for trichomonas as discussed.  You will be contacted in 48-72 hours if remainder of culture results are positive or require treatment. Follow-up with [facility] if problems occur. Return to the ED for new or worsening symptoms.

## 2014-07-04 NOTE — ED Provider Notes (Signed)
CSN: [id number]     Arrival date & time 07/04/14  1306 History   First MD Initiated Contact with Patient 07/04/14 1327     Chief Complaint  Patient presents with  . Hematuria     (Consider location/radiation/quality/duration/timing/severity/associated sxs/prior Treatment) HPI Brianna Cordova is a 23 year old female with no significant past medical history who presents today with 4 days of dysuria, hematuria, flank pain, abdominal pain, vaginal pain. Patient noticed a gradual onset of her symptoms 4 days ago, and noticed her dysuria has become slightly worse. She states her flank pain is intermittent, bilateral, and radiates down the left side of her abdomen.. She denies any alleviating or aggravating factors. She states she has not tried taking any medication to help her pain. She has no associated nausea, vomiting, diarrhea, fever.  Past Medical History  Diagnosis Date  . Depression   . Epilepsy     last seizure age 89  . Migraine    Past Surgical History  Procedure Laterality Date  . Wisdom tooth extraction  Age 23   Family History  Problem Relation Age of Onset  . Diabetes Mother   . Hypertension Father    History  Substance Use Topics  . Smoking status: Never Smoker   . Smokeless tobacco: Never Used  . Alcohol Use: No   OB History   Grav Para Term Preterm Abortions TAB SAB Ect Mult Living                 Review of Systems  Constitutional: Negative for fever.  HENT: Negative for trouble swallowing.   Eyes: Negative for visual disturbance.  Respiratory: Negative for shortness of breath.   Cardiovascular: Negative for chest pain.  Gastrointestinal: Positive for nausea and abdominal pain. Negative for vomiting, diarrhea, constipation and blood in stool.  Endocrine: Negative for polyuria.  Genitourinary: Positive for dysuria, hematuria, flank pain and vaginal pain. Negative for frequency, vaginal bleeding and vaginal discharge.  Musculoskeletal: Negative for neck pain.   Skin: Negative for rash.  Neurological: Negative for dizziness, weakness and numbness.  Psychiatric/Behavioral: Negative.       Allergies  Review of patient's allergies indicates no known allergies.  Home Medications   Prior to Admission medications   Medication Sig Start Date End Date Taking? Authorizing Provider  citalopram (CELEXA) 20 MG tablet Take 30 mg by mouth daily.   Yes Historical Provider, MD  ferrous sulfate 325 (65 FE) MG tablet Take 325 mg by mouth daily with breakfast.   Yes Historical Provider, MD  risperiDONE (RISPERDAL) 1 MG tablet Take 1 mg by mouth at bedtime.   Yes Historical Provider, MD  traMADol-acetaminophen (ULTRACET) 37.5-325 MG per tablet Take 1 tablet by mouth every 6 (six) hours as needed. 07/04/14   Garlon Hatchet, PA-C   BP 116/74  Pulse 64  Temp(Src) 99.2 F (37.3 C) (Oral)  Resp 16  SpO2 100%  LMP 06/23/2014 Physical Exam  Constitutional: She is oriented to person, place, and time. She appears well-developed and well-nourished. No distress.  HENT:  Head: Normocephalic and atraumatic.  Mouth/Throat: Oropharynx is clear and moist. No oropharyngeal exudate.  Eyes: Right eye exhibits no discharge. Left eye exhibits no discharge. No scleral icterus.  Neck: Normal range of motion.  Cardiovascular: Normal rate, regular rhythm and S2 normal.   Murmur heard.  Systolic murmur is present with a grade of 2/6  Pulmonary/Chest: Effort normal and breath sounds normal. No accessory muscle usage. Not tachypneic. No respiratory distress.  Abdominal: Soft. Normal  appearance and bowel sounds are normal. There is tenderness in the left upper quadrant and left lower quadrant. There is no rigidity, no guarding, no tenderness at McBurney's point and negative Murphy's sign.  Genitourinary: Uterus normal. There is no rash, tenderness, lesion or injury on the right labia. There is no rash, tenderness, lesion or injury on the left labia. Right adnexum displays no mass, no  tenderness and no fullness. Left adnexum displays tenderness. Left adnexum displays no mass and no fullness. No erythema, tenderness or bleeding around the vagina. No foreign body around the vagina. Vaginal discharge found.  On pelvic exam no obvious rash, lesion, erythema noted to external or internal labia. Speculum advanced with mild discomfort. No blood noted in vaginal vault. Vaginal walls intact. Mild amount of whitish discharge noted around cervix. Bimanual exam: Mild tenderness noted to palpation of the left adnexa. Chaperone present during entire pelvic exam  Musculoskeletal: Normal range of motion. She exhibits no edema and no tenderness.  Neurological: She is alert and oriented to person, place, and time. No cranial nerve deficit. Coordination normal.  Skin: Skin is warm and dry. No rash noted. She is not diaphoretic.  Psychiatric: She has a normal mood and affect.    ED Course  Procedures (including critical care time) Labs Review Labs Reviewed  WET PREP, GENITAL - Abnormal; Notable for the following:    Trich, Wet Prep FEW (*)    All other components within normal limits  URINALYSIS, ROUTINE W REFLEX MICROSCOPIC - Abnormal; Notable for the following:    Color, Urine AMBER (*)    APPearance CLOUDY (*)    Hgb urine dipstick LARGE (*)    Leukocytes, UA LARGE (*)    All other components within normal limits  URINE MICROSCOPIC-ADD ON - Abnormal; Notable for the following:    Squamous Epithelial / LPF FEW (*)    Bacteria, UA FEW (*)    All other components within normal limits  CBC WITH DIFFERENTIAL - Abnormal; Notable for the following:    Hemoglobin 11.8 (*)    HCT 35.6 (*)    All other components within normal limits  GC/CHLAMYDIA PROBE AMP  PREGNANCY, URINE  COMPREHENSIVE METABOLIC PANEL    Imaging Review    EKG Interpretation None      MDM   Final diagnoses:  Nephrolithiasis  Trichomonas infection   23 year old female with 4 days of flank pain, abdominal  pain, dysuria, vaginal "burning", hematuria. Initial concern for possible nephrolithiasis, UTI, STI, other abdominal pathology. Workup to include CBC, CMP, UA, urine pregnancy, pelvic exam with GC Chlamydia and wet prep. CT abdomen and pelvis without contrast ordered to rule out kidney stone due to location of patient's abdominal pain and based off of left adnexal tenderness and hematuria noted in UA.  Labs as noted above. Wet prep returned with positive for trichomonas.  4:00 PM: Patient awaiting for CT. I discussed plan of this patient is to treat for trichomonas and followup with CT results and treat based on the CT results. Patient signed out to Sharilyn Sites, PA-C.   Signed,  Ladona Mow, PA-C 6:05 PM    This patient seen and discussed with Dr. Toy Cookey, M.D.     Monte Fantasia, PA-C 07/04/14 1805

## 2014-07-04 NOTE — ED Provider Notes (Signed)
Patient received in sign out from PA Slaton at shift change.  Abdominal/flank pain with radiation down left side, vag burning, hematuria. TTP in LUQ and LLQ on exam.  Pelvic exam without adnexal or CMT,  + Trich.  No prior hx of STD.  Plan:  Stone study pending, will tx for trich.  Follow results and dispo accordingly.  Per PA Rexanne Mano and Dr. Micheline Maze, not felt that coverage for PID indicated at this time.  Results for orders placed during the hospital encounter of 07/04/14  WET PREP, GENITAL      Result Value Ref Range   Yeast Wet Prep HPF POC NONE SEEN  NONE SEEN   Trich, Wet Prep FEW (*) NONE SEEN   Clue Cells Wet Prep HPF POC NONE SEEN  NONE SEEN   WBC, Wet Prep HPF POC NONE SEEN  NONE SEEN  URINALYSIS, ROUTINE W REFLEX MICROSCOPIC      Result Value Ref Range   Color, Urine AMBER (*) YELLOW   APPearance CLOUDY (*) CLEAR   Specific Gravity, Urine 1.022  1.005 - 1.030   pH 5.5  5.0 - 8.0   Glucose, UA NEGATIVE  NEGATIVE mg/dL   Hgb urine dipstick LARGE (*) NEGATIVE   Bilirubin Urine NEGATIVE  NEGATIVE   Ketones, ur NEGATIVE  NEGATIVE mg/dL   Protein, ur NEGATIVE  NEGATIVE mg/dL   Urobilinogen, UA 0.2  0.0 - 1.0 mg/dL   Nitrite NEGATIVE  NEGATIVE   Leukocytes, UA LARGE (*) NEGATIVE  PREGNANCY, URINE      Result Value Ref Range   Preg Test, Ur NEGATIVE  NEGATIVE  URINE MICROSCOPIC-ADD ON      Result Value Ref Range   Squamous Epithelial / LPF FEW (*) RARE   WBC, UA 21-50  <3 WBC/hpf   RBC / HPF TOO NUMEROUS TO COUNT  <3 RBC/hpf   Bacteria, UA FEW (*) RARE   Urine-Other MUCOUS PRESENT    CBC WITH DIFFERENTIAL      Result Value Ref Range   WBC 7.0  4.0 - 10.5 K/uL   RBC 4.52  3.87 - 5.11 MIL/uL   Hemoglobin 11.8 (*) 12.0 - 15.0 g/dL   HCT 82.9 (*) 56.2 - 13.0 %   MCV 78.8  78.0 - 100.0 fL   MCH 26.1  26.0 - 34.0 pg   MCHC 33.1  30.0 - 36.0 g/dL   RDW 86.5  78.4 - 69.6 %   Platelets 235  150 - 400 K/uL   Neutrophils Relative % 61  43 - 77 %   Neutro Abs 4.2  1.7 - 7.7 K/uL   Lymphocytes Relative 30  12 - 46 %   Lymphs Abs 2.1  0.7 - 4.0 K/uL   Monocytes Relative 8  3 - 12 %   Monocytes Absolute 0.6  0.1 - 1.0 K/uL   Eosinophils Relative 1  0 - 5 %   Eosinophils Absolute 0.1  0.0 - 0.7 K/uL   Basophils Relative 0  0 - 1 %   Basophils Absolute 0.0  0.0 - 0.1 K/uL  COMPREHENSIVE METABOLIC PANEL      Result Value Ref Range   Sodium 140  137 - 147 mEq/L   Potassium 4.1  3.7 - 5.3 mEq/L   Chloride 103  96 - 112 mEq/L   CO2 24  19 - 32 mEq/L   Glucose, Bld 95  70 - 99 mg/dL   BUN 8  6 - 23 mg/dL   Creatinine, Ser 2.95  0.50 - 1.10 mg/dL   Calcium 9.2  8.4 - 19.1 mg/dL   Total Protein 8.0  6.0 - 8.3 g/dL   Albumin 4.2  3.5 - 5.2 g/dL   AST 17  0 - 37 U/L   ALT 15  0 - 35 U/L   Alkaline Phosphatase 68  39 - 117 U/L   Total Bilirubin 0.9  0.3 - 1.2 mg/dL   GFR calc non Af Amer >90  >90 mL/min   GFR calc Af Amer >90  >90 mL/min   Anion gap 13  5 - 15   Ct Renal Stone Study  07/04/2014   CLINICAL DATA:  Hematuria, left flank pain  EXAM: CT RENAL STONE PROTOCOL  TECHNIQUE: Multidetector CT imaging of the abdomen and pelvis was performed following the standard protocol without intravenous contrast  COMPARISON:  None.  FINDINGS: Lung bases are unremarkable. Sagittal images of the spine are unremarkable. Unenhanced liver shows no biliary ductal dilatation. No calcified gallstones are noted within gallbladder. Unenhanced pancreas, spleen and adrenal glands are unremarkable. Unenhanced kidneys are symmetrical in size. No hydronephrosis or hydroureter. There is nonobstructive calcified calculus in lower pole of the right kidney measures 2 mm.  No calcified ureteral calculi.  No aortic aneurysm.  No small bowel obstruction. No ascites or free air. No adenopathy. There is no pericecal inflammation. The terminal ileum is unremarkable. Small amount of pelvic free fluid. Bilateral distal ureter is unremarkable. No calcified calculi are noted within urinary bladder.  There is a left  ovarian cyst measures 2.2 cm.  IMPRESSION: 1. There is right nonobstructive nephrolithiasis. No hydronephrosis or hydroureter. 2. No calcified ureteral calculi.  3. No pericecal inflammation.  No small bowel obstruction. 4. Small amount of pelvic free fluid. There is a left ovarian cyst measures 2.2 cm. 5. No calcified calculi are noted within urinary bladder.   Electronically Signed   By: Natasha Mead M.D.   On: 07/04/2014 16:44    CT revealing right nephrolithiasis, non-obstruction.  No ureteral calculi.  On repeat evaluation pt resting comfortably in bed, currently laughing and joking with parents.  She is in NAD.  Have treated for trich with 2g Flagyl in the ED, gc/chl pending.  D/c home with pain control.  FU with urology.  Discussed plan with patient, he/she acknowledged understanding and agreed with plan of care.  Return precautions given for new or worsening symptoms.  Garlon Hatchet, PA-C 07/04/14 [phone]

## 2014-07-06 LAB — GC/CHLAMYDIA PROBE AMP
CT Probe RNA: NEGATIVE
GC Probe RNA: NEGATIVE

## 2014-07-06 NOTE — ED Provider Notes (Signed)
Medical screening examination/treatment/procedure(s) were conducted as a shared visit with non-physician practitioner(s) and myself.  I personally evaluated the patient during the encounter.   EKG Interpretation None      Pt presents w/ dysuria/frequency, migratory low back pain and L sided ab pain. Pt in NAD on PE, +LUQ/LLQ ttp on PE w/o rebound or guarding. No CVA tenderness. CT stone study ordered. Care transferred to oncoming team. Ddx includes kidney stone vs early pyelo.   Toy Cookey, MD 07/06/14 [phone]

## 2014-07-07 NOTE — ED Provider Notes (Signed)
Medical screening examination/treatment/procedure(s) were performed by non-physician practitioner and as supervising physician I was immediately available for consultation/collaboration.   EKG Interpretation None        Richardean Canal, MD 07/07/14 [phone]

## 2014-07-13 ENCOUNTER — Ambulatory Visit (HOSPITAL_COMMUNITY): Payer: Self-pay | Admitting: Psychiatry

## 2014-07-15 ENCOUNTER — Ambulatory Visit (INDEPENDENT_AMBULATORY_CARE_PROVIDER_SITE_OTHER): Payer: PRIVATE HEALTH INSURANCE | Admitting: Psychiatry

## 2014-07-15 ENCOUNTER — Encounter (HOSPITAL_COMMUNITY): Payer: Self-pay | Admitting: Psychiatry

## 2014-07-15 VITALS — BP 127/88 | HR 78 | Ht 61.0 in | Wt 184.8 lb

## 2014-07-15 DIAGNOSIS — F319 Bipolar disorder, unspecified: Secondary | ICD-10-CM

## 2014-07-15 MED ORDER — CITALOPRAM HYDROBROMIDE 20 MG PO TABS: 30.0000 mg | ORAL_TABLET | Freq: Every day | ORAL | Status: DC

## 2014-07-15 MED ORDER — RISPERIDONE 1 MG PO TABS: 1.0000 mg | ORAL_TABLET | Freq: Every day | ORAL | Status: DC

## 2014-07-15 NOTE — Progress Notes (Signed)
[facility] Progress Note  Brianna Cordova [id number] 23 y.o.  07/15/2014 2:30 PM  Chief Complaint:  I am feeling better.     History of Present Illness:  Brianna Cordova came for her followup appointment.  She was seen 2 weeks ago and she was complaining of increased depression, crying spells, having thoughts of cutting her wrist.  She was without medication.  She was restarted Celexa and Risperdal.  She is feeling better.  She is able to sleep better.  She described less complain of irritability anger and mood swings.  Her sleep is improved from the past.  She continues to have some issues with her mother however she is keeping her distance from her.  She enjoys working and she has noticed increase in energy and less depression.  She is more social and active.  She denies any side effects of medication.  Her paranoia and hallucination on a sentence of this week and for the past.  She wants to continue her current psychotropic medication.  Her appetite is okay.  She is not drinking or using any illegal substances.  She is working at[employer].  Currently she is living with her mother however she is trying to save money so that she can have her own place.  Suicidal Ideation: No Plan Formed: No Patient has means to carry out plan: No  Homicidal Ideation: No Plan Formed: No Patient has means to carry out plan: No  Medical History; Her primary care physician is Dr. Willey Blade .  Patient has history of epilepsy and migraine however she's not taking any medication.    Past Psychiatric History/Hospitalization(s) Patient was admitted to behavioral [facility] in 2013.  She was admitted because of suicidal thinking, she wanted to drove her car into something.  Patient has history of depression, mood swings, anger since childhood.  She has history of cutting her wrists in the past.  She was diagnosed with major depressive disorder, bipolar disorder and borderline personality  disorder.  She also endorsed history of hallucination Anxiety: No Bipolar Disorder: Yes Depression: Yes Mania: Yes Psychosis: Yes Schizophrenia: No Personality Disorder: Cluster B. traits Hospitalization for psychiatric illness: No History of Electroconvulsive Shock Therapy: No Prior Suicide Attempts: Yes   Review of Systems: Psychiatric: Agitation: No Hallucination: No Depressed Mood: No Insomnia: No Hypersomnia: No Altered Concentration: No Feels Worthless: No Grandiose Ideas: No Belief In Special Powers: No New/Increased Substance Abuse: No Compulsions: No  Neurologic: Headache: Yes Seizure: History of seizures but not taking any medication Paresthesias: No    Outpatient Encounter Prescriptions as of 07/15/2014  Medication Sig  . citalopram (CELEXA) 20 MG tablet Take 1.5 tablets (30 mg total) by mouth daily.  . ferrous sulfate 325 (65 FE) MG tablet Take 325 mg by mouth daily with breakfast.  . risperiDONE (RISPERDAL) 1 MG tablet Take 1 tablet (1 mg total) by mouth at bedtime.  . traMADol-acetaminophen (ULTRACET) 37.5-325 MG per tablet Take 1 tablet by mouth every 6 (six) hours as needed.  . [DISCONTINUED] citalopram (CELEXA) 20 MG tablet Take 30 mg by mouth daily.  . [DISCONTINUED] risperiDONE (RISPERDAL) 1 MG tablet Take 1 mg by mouth at bedtime.    Recent Results (from the past 2160 hour(s))  URINALYSIS, ROUTINE W REFLEX MICROSCOPIC     Status: Abnormal   Collection Time    07/04/14  1:18 PM      Result Value Ref Range   Color, Urine AMBER (*) YELLOW   Comment: BIOCHEMICALS MAY  BE AFFECTED BY COLOR   APPearance CLOUDY (*) CLEAR   Specific Gravity, Urine 1.022  1.005 - 1.030   pH 5.5  5.0 - 8.0   Glucose, UA NEGATIVE  NEGATIVE mg/dL   Hgb urine dipstick LARGE (*) NEGATIVE   Bilirubin Urine NEGATIVE  NEGATIVE   Ketones, ur NEGATIVE  NEGATIVE mg/dL   Protein, ur NEGATIVE  NEGATIVE mg/dL   Urobilinogen, UA 0.2  0.0 - 1.0 mg/dL   Nitrite NEGATIVE  NEGATIVE    Leukocytes, UA LARGE (*) NEGATIVE  URINE MICROSCOPIC-ADD ON     Status: Abnormal   Collection Time    07/04/14  1:18 PM      Result Value Ref Range   Squamous Epithelial / LPF FEW (*) RARE   WBC, UA 21-50  <3 WBC/hpf   RBC / HPF TOO NUMEROUS TO COUNT  <3 RBC/hpf   Bacteria, UA FEW (*) RARE   Urine-Other MUCOUS PRESENT     Comment: TRICHOMONAS PRESENT  PREGNANCY, URINE     Status: None   Collection Time    07/04/14  1:49 PM      Result Value Ref Range   Preg Test, Ur NEGATIVE  NEGATIVE   Comment:            THE SENSITIVITY OF THIS     METHODOLOGY IS >20 mIU/mL.  WET PREP, GENITAL     Status: Abnormal   Collection Time    07/04/14  3:11 PM      Result Value Ref Range   Yeast Wet Prep HPF POC NONE SEEN  NONE SEEN   Trich, Wet Prep FEW (*) NONE SEEN   Clue Cells Wet Prep HPF POC NONE SEEN  NONE SEEN   WBC, Wet Prep HPF POC NONE SEEN  NONE SEEN  GC/CHLAMYDIA PROBE AMP     Status: None   Collection Time    07/04/14  3:11 PM      Result Value Ref Range   CT Probe RNA NEGATIVE  NEGATIVE   GC Probe RNA NEGATIVE  NEGATIVE   Comment: (NOTE)                                                                                               **Normal Reference Range: Negative**          Assay performed using the Gen-Probe APTIMA COMBO2 (R) Assay.     Acceptable specimen types for this assay include APTIMA Swabs (Unisex,     endocervical, urethral, or vaginal), first void urine, and ThinPrep     liquid based cytology samples.     Performed at [facility]  CBC WITH DIFFERENTIAL     Status: Abnormal   Collection Time    07/04/14  3:12 PM      Result Value Ref Range   WBC 7.0  4.0 - 10.5 K/uL   RBC 4.52  3.87 - 5.11 MIL/uL   Hemoglobin 11.8 (*) 12.0 - 15.0 g/dL   HCT 35.6 (*) 36.0 - 46.0 %   MCV 78.8  78.0 - 100.0 fL   MCH 26.1  26.0 -  34.0 pg   MCHC 33.1  30.0 - 36.0 g/dL   RDW 13.0  11.5 - 15.5 %   Platelets 235  150 - 400 K/uL   Neutrophils Relative % 61  43 - 77 %   Neutro  Abs 4.2  1.7 - 7.7 K/uL   Lymphocytes Relative 30  12 - 46 %   Lymphs Abs 2.1  0.7 - 4.0 K/uL   Monocytes Relative 8  3 - 12 %   Monocytes Absolute 0.6  0.1 - 1.0 K/uL   Eosinophils Relative 1  0 - 5 %   Eosinophils Absolute 0.1  0.0 - 0.7 K/uL   Basophils Relative 0  0 - 1 %   Basophils Absolute 0.0  0.0 - 0.1 K/uL  COMPREHENSIVE METABOLIC PANEL     Status: None   Collection Time    07/04/14  3:12 PM      Result Value Ref Range   Sodium 140  137 - 147 mEq/L   Potassium 4.1  3.7 - 5.3 mEq/L   Chloride 103  96 - 112 mEq/L   CO2 24  19 - 32 mEq/L   Glucose, Bld 95  70 - 99 mg/dL   BUN 8  6 - 23 mg/dL   Creatinine, Ser 0.87  0.50 - 1.10 mg/dL   Calcium 9.2  8.4 - 10.5 mg/dL   Total Protein 8.0  6.0 - 8.3 g/dL   Albumin 4.2  3.5 - 5.2 g/dL   AST 17  0 - 37 U/L   ALT 15  0 - 35 U/L   Alkaline Phosphatase 68  39 - 117 U/L   Total Bilirubin 0.9  0.3 - 1.2 mg/dL   GFR calc non Af Amer >90  >90 mL/min   GFR calc Af Amer >90  >90 mL/min   Comment: (NOTE)     The eGFR has been calculated using the CKD EPI equation.     This calculation has not been validated in all clinical situations.     eGFR's persistently <90 mL/min signify possible Chronic Kidney     Disease.   Anion gap 13  5 - 15      Physical Exam: Constitutional:  BP 127/88  Pulse 78  Ht 5' 1"  (1.549 m)  Wt 184 lb 12.8 oz (83.825 kg)  BMI 34.94 kg/m2  LMP 06/23/2014  Musculoskeletal: Strength & Muscle Tone: within normal limits Gait & Station: normal Patient leans: N/A  Mental Status Examination;  Patient is obese female who is casually dressed and fairly groomed.  She is pleasant and cooperative.  She maintains good eye contact.  She described her mood is improved from the past when her affect is mood appropriate.  She denies any active or passive suicidal thoughts or homicidal thoughts.  She denies any auditory or visual hallucination.  Her paranoia is less intense from the past.  She has no delusions.  She has  no tremors or shakes.  Her psychomotor activity is normal.  She is alert and oriented x3.  Her insight judgment and impulse control is okay.  Established Problem, Stable/Improving (1), Review of Last Therapy Session (1) and Review of Medication Regimen & Side Effects (2)  Assessment: Axis I: Bipolar disorder NOS,  Axis II: Cluster B. traits  Axis III:  Past Medical History  Diagnosis Date  . Depression   . Epilepsy     last seizure age 25  . Migraine     Axis IV: Mild  Plan:  Patient is doing better since she is taking her medication on time.  Reassurance given and reinforced medication compliance .  Continue Celexa 30 mg daily and Risperdal 1 mg at bedtime.  Recommended to call us back if she has any question or any concern.  I will see her again in 2 months.  Tameca Jerez T., MD 07/15/2014

## 2014-08-02 ENCOUNTER — Emergency Department (HOSPITAL_COMMUNITY): Payer: No Typology Code available for payment source

## 2014-08-02 ENCOUNTER — Encounter (HOSPITAL_COMMUNITY): Payer: Self-pay | Admitting: Emergency Medicine

## 2014-08-02 ENCOUNTER — Emergency Department (HOSPITAL_COMMUNITY)
Admission: EM | Admit: 2014-08-02 | Discharge: 2014-08-02 | Disposition: A | Discharge status: Home / Self Care | Payer: No Typology Code available for payment source | Attending: Emergency Medicine | Admitting: Emergency Medicine

## 2014-08-02 DIAGNOSIS — F329 Major depressive disorder, single episode, unspecified: Secondary | ICD-10-CM | POA: Insufficient documentation

## 2014-08-02 DIAGNOSIS — F3289 Other specified depressive episodes: Secondary | ICD-10-CM | POA: Insufficient documentation

## 2014-08-02 DIAGNOSIS — R1011 Right upper quadrant pain: Secondary | ICD-10-CM | POA: Insufficient documentation

## 2014-08-02 DIAGNOSIS — G40909 Epilepsy, unspecified, not intractable, without status epilepticus: Secondary | ICD-10-CM | POA: Diagnosis not present

## 2014-08-02 DIAGNOSIS — Z79899 Other long term (current) drug therapy: Secondary | ICD-10-CM | POA: Insufficient documentation

## 2014-08-02 DIAGNOSIS — Z3202 Encounter for pregnancy test, result negative: Secondary | ICD-10-CM | POA: Diagnosis not present

## 2014-08-02 DIAGNOSIS — G43909 Migraine, unspecified, not intractable, without status migrainosus: Secondary | ICD-10-CM | POA: Diagnosis not present

## 2014-08-02 LAB — COMPREHENSIVE METABOLIC PANEL
ALK PHOS: 57 U/L (ref 39–117)
ALT: 12 U/L (ref 0–35)
AST: 14 U/L (ref 0–37)
Albumin: 4.1 g/dL (ref 3.5–5.2)
Anion gap: 12 (ref 5–15)
BILIRUBIN TOTAL: 0.8 mg/dL (ref 0.3–1.2)
BUN: 7 mg/dL (ref 6–23)
CO2: 25 meq/L (ref 19–32)
Calcium: 9.2 mg/dL (ref 8.4–10.5)
Chloride: 100 mEq/L (ref 96–112)
Creatinine, Ser: 0.87 mg/dL (ref 0.50–1.10)
GLUCOSE: 87 mg/dL (ref 70–99)
POTASSIUM: 4 meq/L (ref 3.7–5.3)
Sodium: 137 mEq/L (ref 137–147)
Total Protein: 7.7 g/dL (ref 6.0–8.3)

## 2014-08-02 LAB — URINALYSIS, ROUTINE W REFLEX MICROSCOPIC
BILIRUBIN URINE: NEGATIVE
GLUCOSE, UA: NEGATIVE mg/dL
Hgb urine dipstick: NEGATIVE
Ketones, ur: NEGATIVE mg/dL
Nitrite: NEGATIVE
PH: 7 (ref 5.0–8.0)
Protein, ur: NEGATIVE mg/dL
Specific Gravity, Urine: 1.014 (ref 1.005–1.030)
Urobilinogen, UA: 0.2 mg/dL (ref 0.0–1.0)

## 2014-08-02 LAB — CBC WITH DIFFERENTIAL/PLATELET
Basophils Absolute: 0 10*3/uL (ref 0.0–0.1)
Basophils Relative: 0 % (ref 0–1)
Eosinophils Absolute: 0.1 10*3/uL (ref 0.0–0.7)
Eosinophils Relative: 1 % (ref 0–5)
HCT: 37.2 % (ref 36.0–46.0)
HEMOGLOBIN: 12.4 g/dL (ref 12.0–15.0)
LYMPHS ABS: 1.6 10*3/uL (ref 0.7–4.0)
LYMPHS PCT: 27 % (ref 12–46)
MCH: 26.5 pg (ref 26.0–34.0)
MCHC: 33.3 g/dL (ref 30.0–36.0)
MCV: 79.5 fL (ref 78.0–100.0)
Monocytes Absolute: 0.6 10*3/uL (ref 0.1–1.0)
Monocytes Relative: 9 % (ref 3–12)
NEUTROS PCT: 63 % (ref 43–77)
Neutro Abs: 3.9 10*3/uL (ref 1.7–7.7)
Platelets: 227 10*3/uL (ref 150–400)
RBC: 4.68 MIL/uL (ref 3.87–5.11)
RDW: 13.4 % (ref 11.5–15.5)
WBC: 6.1 10*3/uL (ref 4.0–10.5)

## 2014-08-02 LAB — LIPASE, BLOOD: Lipase: 18 U/L (ref 11–59)

## 2014-08-02 LAB — URINE MICROSCOPIC-ADD ON

## 2014-08-02 LAB — POC URINE PREG, ED: Preg Test, Ur: NEGATIVE

## 2014-08-02 MED ORDER — MORPHINE SULFATE 4 MG/ML IJ SOLN
4.0000 mg | Freq: Once | INTRAMUSCULAR | Status: AC
  Administered 2014-08-02: 4 mg via INTRAVENOUS
  Filled 2014-08-02: qty 1

## 2014-08-02 MED ORDER — ONDANSETRON HCL 4 MG/2ML IJ SOLN
4.0000 mg | Freq: Once | INTRAMUSCULAR | Status: AC
  Administered 2014-08-02: 4 mg via INTRAVENOUS
  Filled 2014-08-02: qty 2

## 2014-08-02 MED ORDER — SODIUM CHLORIDE 0.9 % IV BOLUS (SEPSIS)
1000.0000 mL | Freq: Once | INTRAVENOUS | Status: AC
  Administered 2014-08-02: 1000 mL via INTRAVENOUS

## 2014-08-02 MED ORDER — HYDROCODONE-ACETAMINOPHEN 5-325 MG PO TABS: 2.0000 | ORAL_TABLET | ORAL | Status: DC | PRN

## 2014-08-02 NOTE — ED Notes (Signed)
Pt c/o RUQ pain, states it started near bladder yesterday took some pain meds and it deceased, to states it then came back in RUQ. Pt denies n/v.

## 2014-08-02 NOTE — Discharge Instructions (Signed)
Take Vicodin as needed for pain. Refer to attached documents for more information. Follow up with your doctor for further evaluation.  °

## 2014-08-02 NOTE — ED Notes (Addendum)
Pt c/o RUQ pain since last night, increasing w/ movement, and constipation x 2 days.  Denies n/v/d.

## 2014-08-02 NOTE — ED Provider Notes (Signed)
CSN: [id number]     Arrival date & time 08/02/14  0844 History   First MD Initiated Contact with Patient 08/02/14 [phone]     Chief Complaint  Patient presents with  . Abdominal Pain     (Consider location/radiation/quality/duration/timing/severity/associated sxs/prior Treatment) HPI Comments: Patient is a 23 year old female with a past medical history of depression who presents with abdominal pain that started yesterday. The pain is located in her RUQ and does not radiate. The pain is described as aching and severe. The pain started gradually and progressively worsened since the onset. No alleviating/aggravating factors. The patient has tried over the counter medication for symptoms without relief. Associated symptoms include nothing. Patient denies fever, headache, NVD, chest pain, SOB, dysuria, constipation, abnormal vaginal bleeding/discharge.     Patient is a 23 y.o. female presenting with abdominal pain.  Abdominal Pain Associated symptoms: no chest pain, no chills, no diarrhea, no dysuria, no fatigue, no fever, no nausea, no shortness of breath and no vomiting     Past Medical History  Diagnosis Date  . Depression   . Epilepsy     last seizure age 55  . Migraine    Past Surgical History  Procedure Laterality Date  . Wisdom tooth extraction  Age 62   Family History  Problem Relation Age of Onset  . Diabetes Mother   . Hypertension Father    History  Substance Use Topics  . Smoking status: Never Smoker   . Smokeless tobacco: Never Used  . Alcohol Use: No   OB History   Grav Para Term Preterm Abortions TAB SAB Ect Mult Living                 Review of Systems  Constitutional: Negative for fever, chills and fatigue.  HENT: Negative for trouble swallowing.   Eyes: Negative for visual disturbance.  Respiratory: Negative for shortness of breath.   Cardiovascular: Negative for chest pain and palpitations.  Gastrointestinal: Positive for abdominal pain. Negative for  nausea, vomiting and diarrhea.  Genitourinary: Negative for dysuria and difficulty urinating.  Musculoskeletal: Negative for arthralgias and neck pain.  Skin: Negative for color change.  Neurological: Negative for dizziness and weakness.  Psychiatric/Behavioral: Negative for dysphoric mood.      Allergies  Review of patient's allergies indicates no known allergies.  Home Medications   Prior to Admission medications   Medication Sig Start Date End Date Taking? Authorizing Provider  citalopram (CELEXA) 20 MG tablet Take 20 mg by mouth daily with breakfast.   Yes Historical Provider, MD  risperiDONE (RISPERDAL) 1 MG tablet Take 1 mg by mouth at bedtime.   Yes Historical Provider, MD  traMADol-acetaminophen (ULTRACET) 37.5-325 MG per tablet Take 1 tablet by mouth every 6 (six) hours as needed for moderate pain.   Yes Historical Provider, MD   BP 124/75  Pulse 61  Temp(Src) 98.3 F (36.8 C) (Oral)  Resp 14  SpO2 99%  LMP 07/20/2014 Physical Exam  Nursing note and vitals reviewed. Constitutional: She is oriented to person, place, and time. She appears well-developed and well-nourished. No distress.  HENT:  Head: Normocephalic and atraumatic.  Eyes: Conjunctivae and EOM are normal.  Neck: Normal range of motion.  Cardiovascular: Normal rate and regular rhythm.  Exam reveals no gallop and no friction rub.   No murmur heard. Pulmonary/Chest: Effort normal and breath sounds normal. She has no wheezes. She has no rales. She exhibits no tenderness.  Abdominal: Soft. She exhibits no distension. There is  tenderness. There is no rebound and no guarding.  RUQ tenderness to palpation. No other focal tenderness or peritoneal signs.   Musculoskeletal: Normal range of motion.  Neurological: She is alert and oriented to person, place, and time. Coordination normal.  Speech is goal-oriented. Moves limbs without ataxia.   Skin: Skin is warm and dry.  Psychiatric: She has a normal mood and  affect. Her behavior is normal.    ED Course  Procedures (including critical care time) Labs Review Labs Reviewed  URINALYSIS, ROUTINE W REFLEX MICROSCOPIC - Abnormal; Notable for the following:    APPearance CLOUDY (*)    Leukocytes, UA SMALL (*)    All other components within normal limits  URINE MICROSCOPIC-ADD ON - Abnormal; Notable for the following:    Squamous Epithelial / LPF MANY (*)    Bacteria, UA FEW (*)    All other components within normal limits  CBC WITH DIFFERENTIAL  COMPREHENSIVE METABOLIC PANEL  LIPASE, BLOOD  POC URINE PREG, ED    Imaging Review US Abdomen Complete  08/02/2014   CLINICAL DATA:  Evaluate for cholelithiasis. Right upper quadrant abdominal pain.  EXAM: ULTRASOUND ABDOMEN COMPLETE  COMPARISON:  CT 07/04/2014  FINDINGS: Gallbladder:  No gallstones or wall thickening visualized. No sonographic Murphy sign noted.  Common bile duct:  Diameter: 2.2 mm  Liver:  No focal lesion identified. Within normal limits in parenchymal echogenicity.  IVC:  No abnormality visualized.  Pancreas:  Visualized portion unremarkable.  Spleen:  Size and appearance within normal limits.  Right Kidney:  Length: 10.1 cm. Echogenicity within normal limits. No mass or hydronephrosis visualized.  Left Kidney:  Length: 10.4 cm. Echogenicity within normal limits. No mass or hydronephrosis visualized.  Abdominal aorta:  No aneurysm visualized.  Other findings:  None.  IMPRESSION: No cholelithiasis or sonographic evidence for acute cholecystitis.   Electronically Signed   By: Annia Belt M.D.   On: 08/02/2014 11:19     EKG Interpretation None      MDM   Final diagnoses:  Right upper quadrant pain    Patient labs, urinalysis, and US unremarkable for acute changes. Patient feeling better after receiving pain medication. Patient will be discharged with Vicodin for pain. Vitals stable and patient afebrile. Patient instructed to return to the ED with worsening or concerning symptoms.      Emilia Beck, [place] 08/03/14 [phone]

## 2014-08-02 NOTE — ED Notes (Signed)
Bed: WA22 Expected date:  Expected time:  Means of arrival:  Comments: 

## 2014-08-02 NOTE — ED Notes (Signed)
MD at bedside. EDPA PRESENT TO RE EVALUATE PT 

## 2014-08-03 NOTE — ED Provider Notes (Signed)
Medical screening examination/treatment/procedure(s) were performed by non-physician practitioner and as supervising physician I was immediately available for consultation/collaboration.   EKG Interpretation None       Val Schiavo, MD 08/03/14 1511 

## 2014-09-14 ENCOUNTER — Ambulatory Visit (HOSPITAL_COMMUNITY): Payer: Self-pay | Admitting: Psychiatry

## 2014-09-20 ENCOUNTER — Ambulatory Visit (INDEPENDENT_AMBULATORY_CARE_PROVIDER_SITE_OTHER): Payer: PRIVATE HEALTH INSURANCE | Admitting: Psychiatry

## 2014-09-20 ENCOUNTER — Encounter (HOSPITAL_COMMUNITY): Payer: Self-pay | Admitting: Psychiatry

## 2014-09-20 VITALS — BP 121/75 | HR 62 | Ht 61.25 in | Wt 172.4 lb

## 2014-09-20 DIAGNOSIS — F3131 Bipolar disorder, current episode depressed, mild: Secondary | ICD-10-CM

## 2014-09-20 DIAGNOSIS — F319 Bipolar disorder, unspecified: Secondary | ICD-10-CM

## 2014-09-20 MED ORDER — CITALOPRAM HYDROBROMIDE 20 MG PO TABS: ORAL_TABLET | ORAL | Status: DC

## 2014-09-20 MED ORDER — RISPERIDONE 1 MG PO TABS: ORAL_TABLET | ORAL | Status: DC

## 2014-09-20 NOTE — Progress Notes (Signed)
[facility] Progress Note  Brianna Cordova [id number] 23 y.o.  09/20/2014 3:49 PM  Chief Complaint:  I am feeling tired.  I want to cut down the Risperdal.     History of Present Illness:  Brianna Cordova came for her followup appointment.  She is complaining of tiredness and lack of energy during the day.  She reported that her job is very stressful and physical and when she comes home she has no energy.  She wants to cut down the Risperdal.  She feel her depression paranoia and irritability is better.  She is taking Celexa 30 mg.  She denies any side effects other than that she's been feeling very sedated.  She had a good relationship with her boyfriend.  She is not drinking or using any illegal substances.  Recently she visited emergency room because of abdominal pain.  She has UTI and she has given antibiotic which she finished.  She is gaining weight and she is not happy about it.  She admitted that she is not motivated to do anything because she gets very tired.  She continues to work Surveyor, mining.  She lives with her mother and she is trying to save money.  She is excited that she may move with her boyfriend in January.  Suicidal Ideation: No Plan Formed: No Patient has means to carry out plan: No  Homicidal Ideation: No Plan Formed: No Patient has means to carry out plan: No  Medical History; Her primary care physician is Dr. Willey Blade .  Patient has history of epilepsy and migraine however she's not taking any medication.    Past Psychiatric History/Hospitalization(s) Patient was admitted to behavioral [facility] in 2013.  She was admitted because of suicidal thinking, she wanted to drove her car into something.  Patient has history of depression, mood swings, anger since childhood.  She has history of cutting her wrists in the past.  She was diagnosed with major depressive disorder, bipolar disorder and borderline personality disorder.  She also endorsed history  of hallucination Anxiety: No Bipolar Disorder: Yes Depression: Yes Mania: Yes Psychosis: Yes Schizophrenia: No Personality Disorder: Cluster B. traits Hospitalization for psychiatric illness: No History of Electroconvulsive Shock Therapy: No Prior Suicide Attempts: Yes   Review of Systems: Psychiatric: Agitation: No Hallucination: No Depressed Mood: No Insomnia: No Hypersomnia: No Altered Concentration: No Feels Worthless: No Grandiose Ideas: No Belief In Special Powers: No New/Increased Substance Abuse: No Compulsions: No  Neurologic: Headache: Yes Seizure: History of seizures but not taking any medication Paresthesias: No    Outpatient Encounter Prescriptions as of 09/20/2014  Medication Sig  . citalopram (CELEXA) 20 MG tablet Take 1 and 1/2 tab daily  . risperiDONE (RISPERDAL) 1 MG tablet Take 1/2 to 1 tab at bed time  . [DISCONTINUED] citalopram (CELEXA) 20 MG tablet Take 20 mg by mouth daily with breakfast.  . [DISCONTINUED] HYDROcodone-acetaminophen (NORCO/VICODIN) 5-325 MG per tablet Take 2 tablets by mouth every 4 (four) hours as needed for moderate pain or severe pain.  . [DISCONTINUED] risperiDONE (RISPERDAL) 1 MG tablet Take 1 mg by mouth at bedtime.  . [DISCONTINUED] traMADol-acetaminophen (ULTRACET) 37.5-325 MG per tablet Take 1 tablet by mouth every 6 (six) hours as needed for moderate pain.    Recent Results (from the past 2160 hour(s))  Urinalysis, Routine w reflex microscopic     Status: Abnormal   Collection Time: 07/04/14  1:18 PM  Result Value Ref Range   Color, Urine AMBER (A) YELLOW  Comment: BIOCHEMICALS MAY BE AFFECTED BY COLOR   APPearance CLOUDY (A) CLEAR   Specific Gravity, Urine 1.022 1.005 - 1.030   pH 5.5 5.0 - 8.0   Glucose, UA NEGATIVE NEGATIVE mg/dL   Hgb urine dipstick LARGE (A) NEGATIVE   Bilirubin Urine NEGATIVE NEGATIVE   Ketones, ur NEGATIVE NEGATIVE mg/dL   Protein, ur NEGATIVE NEGATIVE mg/dL   Urobilinogen, UA 0.2 0.0  - 1.0 mg/dL   Nitrite NEGATIVE NEGATIVE   Leukocytes, UA LARGE (A) NEGATIVE  Urine microscopic-add on     Status: Abnormal   Collection Time: 07/04/14  1:18 PM  Result Value Ref Range   Squamous Epithelial / LPF FEW (A) RARE   WBC, UA 21-50 <3 WBC/hpf   RBC / HPF TOO NUMEROUS TO COUNT <3 RBC/hpf   Bacteria, UA FEW (A) RARE   Urine-Other MUCOUS PRESENT     Comment: TRICHOMONAS PRESENT  Pregnancy, urine     Status: None   Collection Time: 07/04/14  1:49 PM  Result Value Ref Range   Preg Test, Ur NEGATIVE NEGATIVE    Comment:        THE SENSITIVITY OF THIS METHODOLOGY IS >20 mIU/mL.  Wet prep, genital     Status: Abnormal   Collection Time: 07/04/14  3:11 PM  Result Value Ref Range   Yeast Wet Prep HPF POC NONE SEEN NONE SEEN   Trich, Wet Prep FEW (A) NONE SEEN   Clue Cells Wet Prep HPF POC NONE SEEN NONE SEEN   WBC, Wet Prep HPF POC NONE SEEN NONE SEEN  GC/Chlamydia Probe Amp     Status: None   Collection Time: 07/04/14  3:11 PM  Result Value Ref Range   CT Probe RNA NEGATIVE NEGATIVE   GC Probe RNA NEGATIVE NEGATIVE    Comment: (NOTE)                                                                                       **Normal Reference Range: Negative**      Assay performed using the Gen-Probe APTIMA COMBO2 (R) Assay. Acceptable specimen types for this assay include APTIMA Swabs (Unisex, endocervical, urethral, or vaginal), first void urine, and ThinPrep liquid based cytology samples. Performed at [facility]  CBC with Differential     Status: Abnormal   Collection Time: 07/04/14  3:12 PM  Result Value Ref Range   WBC 7.0 4.0 - 10.5 K/uL   RBC 4.52 3.87 - 5.11 MIL/uL   Hemoglobin 11.8 (L) 12.0 - 15.0 g/dL   HCT 35.6 (L) 36.0 - 46.0 %   MCV 78.8 78.0 - 100.0 fL   MCH 26.1 26.0 - 34.0 pg   MCHC 33.1 30.0 - 36.0 g/dL   RDW 13.0 11.5 - 15.5 %   Platelets 235 150 - 400 K/uL   Neutrophils Relative % 61 43 - 77 %   Neutro Abs 4.2 1.7 - 7.7 K/uL   Lymphocytes  Relative 30 12 - 46 %   Lymphs Abs 2.1 0.7 - 4.0 K/uL   Monocytes Relative 8 3 - 12 %   Monocytes Absolute 0.6 0.1 - 1.0 K/uL   Eosinophils Relative  1 0 - 5 %   Eosinophils Absolute 0.1 0.0 - 0.7 K/uL   Basophils Relative 0 0 - 1 %   Basophils Absolute 0.0 0.0 - 0.1 K/uL  Comprehensive metabolic panel     Status: None   Collection Time: 07/04/14  3:12 PM  Result Value Ref Range   Sodium 140 137 - 147 mEq/L   Potassium 4.1 3.7 - 5.3 mEq/L   Chloride 103 96 - 112 mEq/L   CO2 24 19 - 32 mEq/L   Glucose, Bld 95 70 - 99 mg/dL   BUN 8 6 - 23 mg/dL   Creatinine, Ser 0.87 0.50 - 1.10 mg/dL   Calcium 9.2 8.4 - 10.5 mg/dL   Total Protein 8.0 6.0 - 8.3 g/dL   Albumin 4.2 3.5 - 5.2 g/dL   AST 17 0 - 37 U/L   ALT 15 0 - 35 U/L   Alkaline Phosphatase 68 39 - 117 U/L   Total Bilirubin 0.9 0.3 - 1.2 mg/dL   GFR calc non Af Amer >90 >90 mL/min   GFR calc Af Amer >90 >90 mL/min    Comment: (NOTE) The eGFR has been calculated using the CKD EPI equation. This calculation has not been validated in all clinical situations. eGFR's persistently <90 mL/min signify possible Chronic Kidney Disease.   Anion gap 13 5 - 15  CBC with Differential     Status: None   Collection Time: 08/02/14  9:26 AM  Result Value Ref Range   WBC 6.1 4.0 - 10.5 K/uL   RBC 4.68 3.87 - 5.11 MIL/uL   Hemoglobin 12.4 12.0 - 15.0 g/dL   HCT 37.2 36.0 - 46.0 %   MCV 79.5 78.0 - 100.0 fL   MCH 26.5 26.0 - 34.0 pg   MCHC 33.3 30.0 - 36.0 g/dL   RDW 13.4 11.5 - 15.5 %   Platelets 227 150 - 400 K/uL   Neutrophils Relative % 63 43 - 77 %   Neutro Abs 3.9 1.7 - 7.7 K/uL   Lymphocytes Relative 27 12 - 46 %   Lymphs Abs 1.6 0.7 - 4.0 K/uL   Monocytes Relative 9 3 - 12 %   Monocytes Absolute 0.6 0.1 - 1.0 K/uL   Eosinophils Relative 1 0 - 5 %   Eosinophils Absolute 0.1 0.0 - 0.7 K/uL   Basophils Relative 0 0 - 1 %   Basophils Absolute 0.0 0.0 - 0.1 K/uL  Comprehensive metabolic panel     Status: None   Collection Time:  08/02/14  9:26 AM  Result Value Ref Range   Sodium 137 137 - 147 mEq/L   Potassium 4.0 3.7 - 5.3 mEq/L   Chloride 100 96 - 112 mEq/L   CO2 25 19 - 32 mEq/L   Glucose, Bld 87 70 - 99 mg/dL   BUN 7 6 - 23 mg/dL   Creatinine, Ser 0.87 0.50 - 1.10 mg/dL   Calcium 9.2 8.4 - 10.5 mg/dL   Total Protein 7.7 6.0 - 8.3 g/dL   Albumin 4.1 3.5 - 5.2 g/dL   AST 14 0 - 37 U/L   ALT 12 0 - 35 U/L   Alkaline Phosphatase 57 39 - 117 U/L   Total Bilirubin 0.8 0.3 - 1.2 mg/dL   GFR calc non Af Amer >90 >90 mL/min   GFR calc Af Amer >90 >90 mL/min    Comment: (NOTE) The eGFR has been calculated using the CKD EPI equation. This calculation has not been validated  in all clinical situations. eGFR's persistently <90 mL/min signify possible Chronic Kidney Disease.   Anion gap 12 5 - 15  Lipase, blood     Status: None   Collection Time: 08/02/14  9:26 AM  Result Value Ref Range   Lipase 18 11 - 59 U/L  Urinalysis, Routine w reflex microscopic     Status: Abnormal   Collection Time: 08/02/14 10:40 AM  Result Value Ref Range   Color, Urine YELLOW YELLOW   APPearance CLOUDY (A) CLEAR   Specific Gravity, Urine 1.014 1.005 - 1.030   pH 7.0 5.0 - 8.0   Glucose, UA NEGATIVE NEGATIVE mg/dL   Hgb urine dipstick NEGATIVE NEGATIVE   Bilirubin Urine NEGATIVE NEGATIVE   Ketones, ur NEGATIVE NEGATIVE mg/dL   Protein, ur NEGATIVE NEGATIVE mg/dL   Urobilinogen, UA 0.2 0.0 - 1.0 mg/dL   Nitrite NEGATIVE NEGATIVE   Leukocytes, UA SMALL (A) NEGATIVE  Urine microscopic-add on     Status: Abnormal   Collection Time: 08/02/14 10:40 AM  Result Value Ref Range   Squamous Epithelial / LPF MANY (A) RARE   WBC, UA 7-10 <3 WBC/hpf   Bacteria, UA FEW (A) RARE   Urine-Other TRICHOMONAS PRESENT   POC Urine Pregnancy, ED  (If Pre-menopausal female) - do not order at [facility]     Status: None   Collection Time: 08/02/14 10:50 AM  Result Value Ref Range   Preg Test, Ur NEGATIVE NEGATIVE    Comment:        THE SENSITIVITY OF  THIS METHODOLOGY IS >24 mIU/mL      Physical Exam: Constitutional:  BP 121/75 mmHg  Pulse 62  Ht 5' 1.25" (1.556 m)  Wt 172 lb 6.4 oz (78.2 kg)  BMI 32.30 kg/m2  Musculoskeletal: Strength & Muscle Tone: within normal limits Gait & Station: normal Patient leans: N/A  Mental Status Examination;  Patient is obese female who is casually dressed and fairly groomed.  She is pleasant and cooperative.  She maintains good eye contact.  She described her mood tired and her affect is mood appropriate. She denies any active or passive suicidal thoughts or homicidal thoughts.  She denies any auditory or visual hallucination.  Her paranoia is less intense from the past.  She has no delusions.  She has no tremors or shakes.  Her psychomotor activity is normal.  She is alert and oriented x3.  Her insight judgment and impulse control is okay.  Established Problem, Stable/Improving (1), Review of Psycho-Social Stressors (1), Review or order clinical lab tests (1), Review and summation of old records (2), Review of Last Therapy Session (1), Review of Medication Regimen & Side Effects (2) and Review of New Medication or Change in Dosage (2)  Assessment: Axis I: Bipolar disorder NOS,  Axis II: Cluster B. traits  Axis III:  Past Medical History  Diagnosis Date  . Depression   . Epilepsy     last seizure age 43  . Migraine     Axis IV: Mild   Plan:  I recommended to try Risperdal half to one tablet at bedtime if she feels very tired with a full tablet.  However discussed that symptoms may relapse and then she need to take full tablet.  I also recommended to call us back if she has any question or any concern.  I reviewed her blood work results and collateral information from recent emergency visits.  Encouraged to keep watching her calorie intake and due to her exercise.  I  will see her again in 3 months.  At this time I will continue Celexa 30 mg daily.  She does not have any EPS or any tremors.  Time spent 25 minutes.  More than 50% of the time spent in psychoeducation, counseling and coordination of care.  Discuss safety plan that anytime having active suicidal thoughts or homicidal thoughts then patient need to call 911 or go to the local emergency room.  Steven Basso T., MD 09/20/2014

## 2014-10-20 ENCOUNTER — Encounter (HOSPITAL_COMMUNITY): Payer: Self-pay | Admitting: Emergency Medicine

## 2014-10-20 ENCOUNTER — Emergency Department (HOSPITAL_COMMUNITY)
Admission: EM | Admit: 2014-10-20 | Discharge: 2014-10-20 | Disposition: A | Discharge status: Home / Self Care | Payer: Self-pay | Source: Home / Self Care | Attending: Emergency Medicine | Admitting: Emergency Medicine

## 2014-10-20 DIAGNOSIS — J069 Acute upper respiratory infection, unspecified: Secondary | ICD-10-CM

## 2014-10-20 LAB — POCT RAPID STREP A: Streptococcus, Group A Screen (Direct): NEGATIVE

## 2014-10-20 MED ORDER — IPRATROPIUM BROMIDE 0.06 % NA SOLN: 2.0000 | Freq: Four times a day (QID) | NASAL | Status: DC

## 2014-10-20 MED ORDER — BENZONATATE 100 MG PO CAPS: 100.0000 mg | ORAL_CAPSULE | Freq: Three times a day (TID) | ORAL | Status: DC | PRN

## 2014-10-20 NOTE — ED Provider Notes (Signed)
CSN: [id number]     Arrival date & time 10/20/14  [id number] History   First MD Initiated Contact with Patient 10/20/14 1035     Chief Complaint  Patient presents with  . URI   (Consider location/radiation/quality/duration/timing/severity/associated sxs/prior Treatment) HPI Comments: Works at [employer] Non-smoker PCP: [facility]  Patient is a 23 y.o. female presenting with URI. The history is provided by the patient.  URI Presenting symptoms: congestion, cough and rhinorrhea   Presenting symptoms: no ear pain, no facial pain, no fatigue, no fever and no sore throat   Severity:  Moderate Onset quality:  Gradual Duration:  4 days Timing:  Constant Progression:  Unchanged Chronicity:  New   Past Medical History  Diagnosis Date  . Depression   . Epilepsy     last seizure age 23  . Migraine    Past Surgical History  Procedure Laterality Date  . Wisdom tooth extraction  Age 23   Family History  Problem Relation Age of Onset  . Diabetes Mother   . Hypertension Father    History  Substance Use Topics  . Smoking status: Never Smoker   . Smokeless tobacco: Never Used  . Alcohol Use: No   OB History    No data available     Review of Systems  Constitutional: Negative for fever and fatigue.  HENT: Positive for congestion and rhinorrhea. Negative for ear pain and sore throat.   Respiratory: Positive for cough.   All other systems reviewed and are negative.   Allergies  Review of patient's allergies indicates no known allergies.  Home Medications   Prior to Admission medications   Medication Sig Start Date End Date Taking? Authorizing Provider  citalopram (CELEXA) 20 MG tablet Take 1 and 1/2 tab daily 09/20/14  Yes Cleotis NipperSyed T Arfeen, MD  risperiDONE (RISPERDAL) 1 MG tablet Take 1/2 to 1 tab at bed time 09/20/14  Yes Cleotis NipperSyed T Arfeen, MD  benzonatate (TESSALON) 100 MG capsule Take 1 capsule (100 mg total) by mouth 3 (three) times daily as needed for cough. 10/20/14    Mathis FareJennifer Lee H Aleza Pew, PA  ipratropium (ATROVENT) 0.06 % nasal spray Place 2 sprays into both nostrils 4 (four) times daily. For nasal congestion 10/20/14   Jess BartersJennifer Lee H Shadaya Marschner, PA   BP 126/74 mmHg  Pulse 118  Temp(Src) 98 F (36.7 C) (Oral)  Resp 16  SpO2 99%  LMP 10/08/2014 Physical Exam  Constitutional: She is oriented to person, place, and time. She appears well-developed and well-nourished. No distress.  HENT:  Head: Normocephalic and atraumatic.  Right Ear: Hearing, external ear and ear canal normal. Tympanic membrane is injected.  Left Ear: Hearing, external ear and ear canal normal. Tympanic membrane is injected.  Nose: Mucosal edema present.  Mouth/Throat: Uvula is midline. Mucous membranes are dry. No oral lesions. No trismus in the jaw. No uvula swelling. No oropharyngeal exudate, posterior oropharyngeal edema, posterior oropharyngeal erythema or tonsillar abscesses.  Eyes: Conjunctivae are normal.  Cardiovascular: Normal rate and normal heart sounds.   +mild tachycardia  Pulmonary/Chest: Effort normal and breath sounds normal. No respiratory distress. She has no wheezes.  Abdominal: Soft. Bowel sounds are normal. She exhibits no distension. There is no tenderness.  Musculoskeletal: Normal range of motion.  Neurological: She is alert and oriented to person, place, and time.  Skin: Skin is warm and dry.  Psychiatric: Her behavior is normal. Thought content normal. Her affect is blunt.  Nursing note and vitals reviewed.   ED  Course  Procedures (including critical care time) Labs Review Labs Reviewed  POCT RAPID STREP A ([facility]RG CARE ONLY)    Imaging Review No results found.   MDM   1. URI (upper respiratory infection)   Rapid strep negative Tessalon for cough Atrovent for nasal congestion  PCP follow up if no improvement    Ria ClockJennifer Lee H Tigerlily Christine, PA 10/20/14 1116

## 2014-10-20 NOTE — Discharge Instructions (Signed)
Upper Respiratory Infection, Adult An upper respiratory infection (URI) is also sometimes known as the common cold. The upper respiratory tract includes the nose, sinuses, throat, trachea, and bronchi. Bronchi are the airways leading to the lungs. Most people improve within 1 week, but symptoms can last up to 2 weeks. A residual cough may last even longer.  CAUSES Many different viruses can infect the tissues lining the upper respiratory tract. The tissues become irritated and inflamed and often become very moist. Mucus production is also common. A cold is contagious. You can easily spread the virus to others by oral contact. This includes kissing, sharing a glass, coughing, or sneezing. Touching your mouth or nose and then touching a surface, which is then touched by another person, can also spread the virus. SYMPTOMS  Symptoms typically develop 1 to 3 days after you come in contact with a cold virus. Symptoms vary from person to person. They may include:  Runny nose.  Sneezing.  Nasal congestion.  Sinus irritation.  Sore throat.  Loss of voice (laryngitis).  Cough.  Fatigue.  Muscle aches.  Loss of appetite.  Headache.  Low-grade fever. DIAGNOSIS  You might diagnose your own cold based on familiar symptoms, since most people get a cold 2 to 3 times a year. Your caregiver can confirm this based on your exam. Most importantly, your caregiver can check that your symptoms are not due to another disease such as strep throat, sinusitis, pneumonia, asthma, or epiglottitis. Blood tests, throat tests, and X-rays are not necessary to diagnose a common cold, but they may sometimes be helpful in excluding other more serious diseases. Your caregiver will decide if any further tests are required. RISKS AND COMPLICATIONS  You may be at risk for a more severe case of the common cold if you smoke cigarettes, have chronic heart disease (such as heart failure) or lung disease (such as asthma), or if  you have a weakened immune system. The very young and very old are also at risk for more serious infections. Bacterial sinusitis, middle ear infections, and bacterial pneumonia can complicate the common cold. The common cold can worsen asthma and chronic obstructive pulmonary disease (COPD). Sometimes, these complications can require emergency medical care and may be life-threatening. PREVENTION  The best way to protect against getting a cold is to practice good hygiene. Avoid oral or hand contact with people with cold symptoms. Wash your hands often if contact occurs. There is no clear evidence that vitamin C, vitamin E, echinacea, or exercise reduces the chance of developing a cold. However, it is always recommended to get plenty of rest and practice good nutrition. TREATMENT  Treatment is directed at relieving symptoms. There is no cure. Antibiotics are not effective, because the infection is caused by a virus, not by bacteria. Treatment may include:  Increased fluid intake. Sports drinks offer valuable electrolytes, sugars, and fluids.  Breathing heated mist or steam (vaporizer or shower).  Eating chicken soup or other clear broths, and maintaining good nutrition.  Getting plenty of rest.  Using gargles or lozenges for comfort.  Controlling fevers with ibuprofen or acetaminophen as directed by your caregiver.  Increasing usage of your inhaler if you have asthma. Zinc gel and zinc lozenges, taken in the first 24 hours of the common cold, can shorten the duration and lessen the severity of symptoms. Pain medicines may help with fever, muscle aches, and throat pain. A variety of non-prescription medicines are available to treat congestion and runny nose. Your caregiver   can make recommendations and may suggest nasal or lung inhalers for other symptoms.  HOME CARE INSTRUCTIONS   Only take over-the-counter or prescription medicines for pain, discomfort, or fever as directed by your  caregiver.  Use a warm mist humidifier or inhale steam from a shower to increase air moisture. This may keep secretions moist and make it easier to breathe.  Drink enough water and fluids to keep your urine clear or pale yellow.  Rest as needed.  Return to work when your temperature has returned to normal or as your caregiver advises. You may need to stay home longer to avoid infecting others. You can also use a face mask and careful hand washing to prevent spread of the virus. SEEK MEDICAL CARE IF:   After the first few days, you feel you are getting worse rather than better.  You need your caregiver's advice about medicines to control symptoms.  You develop chills, worsening shortness of breath, or brown or red sputum. These may be signs of pneumonia.  You develop yellow or brown nasal discharge or pain in the face, especially when you bend forward. These may be signs of sinusitis.  You develop a fever, swollen neck glands, pain with swallowing, or white areas in the back of your throat. These may be signs of strep throat. SEEK IMMEDIATE MEDICAL CARE IF:   You have a fever.  You develop severe or persistent headache, ear pain, sinus pain, or chest pain.  You develop wheezing, a prolonged cough, cough up blood, or have a change in your usual mucus (if you have chronic lung disease).  You develop sore muscles or a stiff neck. Document Released: 04/24/2001 Document Revised: 01/21/2012 Document Reviewed: 02/03/2014 [employer] Patient Information 2015 [employer]. This information is not intended to replace advice given to you by your health care provider. Make sure you discuss any questions you have with your health care provider.  

## 2014-10-20 NOTE — ED Notes (Signed)
C/o cold sx onset Sunday Sx include: runny nose, congestion, HA, ST, dry couch Denies f/v/n/d, SOB, wheezing Taking OTC cold meds w/no relief.  Alert, no signs of acute distress.

## 2014-10-22 LAB — CULTURE, GROUP A STREP

## 2014-10-23 NOTE — ED Notes (Signed)
Final report of strep B test negative, no further action required

## 2014-12-21 ENCOUNTER — Ambulatory Visit (HOSPITAL_COMMUNITY): Payer: Self-pay | Admitting: Psychiatry

## 2015-01-24 ENCOUNTER — Ambulatory Visit: Payer: Self-pay

## 2015-02-10 ENCOUNTER — Emergency Department (INDEPENDENT_AMBULATORY_CARE_PROVIDER_SITE_OTHER)
Admission: EM | Admit: 2015-02-10 | Discharge: 2015-02-10 | Disposition: A | Discharge status: Home / Self Care | Payer: Self-pay | Source: Home / Self Care | Attending: Emergency Medicine | Admitting: Emergency Medicine

## 2015-02-10 ENCOUNTER — Encounter (HOSPITAL_COMMUNITY): Payer: Self-pay | Admitting: Emergency Medicine

## 2015-02-10 DIAGNOSIS — J029 Acute pharyngitis, unspecified: Secondary | ICD-10-CM

## 2015-02-10 LAB — POCT RAPID STREP A: Streptococcus, Group A Screen (Direct): NEGATIVE

## 2015-02-10 MED ORDER — IPRATROPIUM BROMIDE 0.06 % NA SOLN: 2.0000 | Freq: Four times a day (QID) | NASAL | Status: DC

## 2015-02-10 MED ORDER — CETIRIZINE HCL 10 MG PO TABS: 10.0000 mg | ORAL_TABLET | Freq: Every day | ORAL | Status: DC

## 2015-02-10 NOTE — Discharge Instructions (Signed)
You have a virus causing your symptoms. You should start to feel better this weekend. You can take Zyrtec daily for the next week to help with congestion. Use Atrovent nasal spray 4 times a day to help with the congestion. You can use Chloraseptic spray, Cepacol lozenges, salt water gargles help with the sore throat. If you are unable to swallow liquids or develop high fevers please come back.

## 2015-02-10 NOTE — ED Notes (Signed)
Patient c/o sore throat since 3/27.  Patient also has a runny nose.  Not talking, but is able to swallow saliva

## 2015-02-10 NOTE — ED Provider Notes (Signed)
CSN: [id number]     Arrival date & time 02/10/15  1048 History   First MD Initiated Contact with Patient 02/10/15 1127     Chief Complaint  Patient presents with  . Sore Throat   (Consider location/radiation/quality/duration/timing/severity/associated sxs/prior Treatment) HPI  She is a 24 year old woman here for evaluation of sore throat. Her symptoms started 4 days ago. She describes a severe sore throat. She is able to eat and drink well. It is painful for her to talk, and her voice is very strained. This is associated with nasal congestion and rhinorrhea. She also describes a cough. No shortness of breath. No fevers. She has some nausea, but no vomiting. No known sick contacts. She has tried some ibuprofen with minimal improvement.  Past Medical History  Diagnosis Date  . Depression   . Epilepsy     last seizure age 55  . Migraine    Past Surgical History  Procedure Laterality Date  . Wisdom tooth extraction  Age 31   Family History  Problem Relation Age of Onset  . Diabetes Mother   . Hypertension Father    History  Substance Use Topics  . Smoking status: Never Smoker   . Smokeless tobacco: Never Used  . Alcohol Use: No   OB History    No data available     Review of Systems  Constitutional: Negative for fever, chills and appetite change.  HENT: Positive for congestion, rhinorrhea, sore throat and voice change. Negative for ear pain and trouble swallowing.   Respiratory: Positive for cough. Negative for shortness of breath.   Gastrointestinal: Positive for nausea. Negative for vomiting.    Allergies  Review of patient's allergies indicates no known allergies.  Home Medications   Prior to Admission medications   Medication Sig Start Date End Date Taking? Authorizing Provider  ibuprofen (ADVIL,MOTRIN) 200 MG tablet Take 200 mg by mouth every 6 (six) hours as needed.   Yes Historical Provider, MD  benzonatate (TESSALON) 100 MG capsule Take 1 capsule (100 mg total)  by mouth 3 (three) times daily as needed for cough. 10/20/14   Ria Clock, PA  cetirizine (ZYRTEC) 10 MG tablet Take 1 tablet (10 mg total) by mouth daily. 02/10/15   Charm Rings, MD  citalopram (CELEXA) 20 MG tablet Take 1 and 1/2 tab daily 09/20/14   Cleotis Nipper, MD  ipratropium (ATROVENT) 0.06 % nasal spray Place 2 sprays into both nostrils 4 (four) times daily. For nasal congestion 02/10/15   Charm Rings, MD  risperiDONE (RISPERDAL) 1 MG tablet Take 1/2 to 1 tab at bed time 09/20/14   Cleotis Nipper, MD   BP 123/84 mmHg  Pulse 83  Resp 16  SpO2 98%  LMP 01/19/2015 Physical Exam  Constitutional: She is oriented to person, place, and time. She appears well-developed and well-nourished. No distress.  HENT:  Nose: Mucosal edema and rhinorrhea present.  Mouth/Throat: Posterior oropharyngeal erythema present. No oropharyngeal exudate.  Moderate cobblestoning  Neck: Neck supple.  Cardiovascular: Normal rate, regular rhythm and normal heart sounds.   No murmur heard. Pulmonary/Chest: Effort normal and breath sounds normal. No respiratory distress. She has no wheezes. She has no rales.  Lymphadenopathy:    She has no cervical adenopathy.  Neurological: She is alert and oriented to person, place, and time.    ED Course  Procedures (including critical care time) Labs Review Labs Reviewed  POCT RAPID STREP A ([facility] CARE ONLY)    Imaging  Review No results found.   MDM   1. Viral pharyngitis    Rapid strep negative. Throat culture sent. Symptomatic treatment with Zyrtec and Atrovent nasal spray. Discussed Chloraseptic spray, Cepacol lozenges, and salt water gargles for sore throat. Return precautions reviewed as in after visit summary.    Charm RingsErin J Shalunda Lindh, MD 02/10/15 [phone]

## 2015-02-12 LAB — CULTURE, GROUP A STREP

## 2015-02-15 ENCOUNTER — Telehealth (HOSPITAL_COMMUNITY): Payer: Self-pay | Admitting: *Deleted

## 2015-02-15 NOTE — ED Notes (Addendum)
Throat culture: Strep beta hemolytic not group A.  Dr. Piedad ClimesHonig said she called on 4/3 and left a message.  Call 1.  She said pt. only needs treatment if still having symptoms.  I called and left a message to call. Call 2. Vassie MoselleYork, Warnie Belair M 02/15/2015 Left message. Call 3. 02/16/2015 Unable to reach pt. by phone x 3.  Letter sent with result and instruction. Vassie MoselleYork, Li Bobo M 02/22/2015

## 2015-04-01 ENCOUNTER — Encounter (HOSPITAL_COMMUNITY): Payer: Self-pay | Admitting: Emergency Medicine

## 2015-04-01 ENCOUNTER — Emergency Department (HOSPITAL_COMMUNITY)
Admission: EM | Admit: 2015-04-01 | Discharge: 2015-04-01 | Disposition: A | Discharge status: Home / Self Care | Payer: Self-pay | Attending: Emergency Medicine | Admitting: Emergency Medicine

## 2015-04-01 DIAGNOSIS — F329 Major depressive disorder, single episode, unspecified: Secondary | ICD-10-CM | POA: Insufficient documentation

## 2015-04-01 DIAGNOSIS — Y929 Unspecified place or not applicable: Secondary | ICD-10-CM | POA: Insufficient documentation

## 2015-04-01 DIAGNOSIS — Y9389 Activity, other specified: Secondary | ICD-10-CM | POA: Insufficient documentation

## 2015-04-01 DIAGNOSIS — Z8669 Personal history of other diseases of the nervous system and sense organs: Secondary | ICD-10-CM | POA: Insufficient documentation

## 2015-04-01 DIAGNOSIS — X58XXXA Exposure to other specified factors, initial encounter: Secondary | ICD-10-CM | POA: Insufficient documentation

## 2015-04-01 DIAGNOSIS — Y998 Other external cause status: Secondary | ICD-10-CM | POA: Insufficient documentation

## 2015-04-01 DIAGNOSIS — Z3202 Encounter for pregnancy test, result negative: Secondary | ICD-10-CM | POA: Insufficient documentation

## 2015-04-01 DIAGNOSIS — R319 Hematuria, unspecified: Secondary | ICD-10-CM

## 2015-04-01 DIAGNOSIS — S39012A Strain of muscle, fascia and tendon of lower back, initial encounter: Secondary | ICD-10-CM | POA: Insufficient documentation

## 2015-04-01 DIAGNOSIS — Z8679 Personal history of other diseases of the circulatory system: Secondary | ICD-10-CM | POA: Insufficient documentation

## 2015-04-01 DIAGNOSIS — N390 Urinary tract infection, site not specified: Secondary | ICD-10-CM | POA: Insufficient documentation

## 2015-04-01 LAB — URINE MICROSCOPIC-ADD ON

## 2015-04-01 LAB — URINALYSIS, ROUTINE W REFLEX MICROSCOPIC
BILIRUBIN URINE: NEGATIVE
Glucose, UA: NEGATIVE mg/dL
Ketones, ur: NEGATIVE mg/dL
Nitrite: NEGATIVE
Protein, ur: NEGATIVE mg/dL
Specific Gravity, Urine: 1.019 (ref 1.005–1.030)
Urobilinogen, UA: 0.2 mg/dL (ref 0.0–1.0)
pH: 7 (ref 5.0–8.0)

## 2015-04-01 LAB — PREGNANCY, URINE: Preg Test, Ur: NEGATIVE

## 2015-04-01 MED ORDER — CEPHALEXIN 500 MG PO CAPS: 500.0000 mg | ORAL_CAPSULE | Freq: Two times a day (BID) | ORAL | Status: DC

## 2015-04-01 MED ORDER — IBUPROFEN 600 MG PO TABS: 600.0000 mg | ORAL_TABLET | Freq: Four times a day (QID) | ORAL | Status: DC | PRN

## 2015-04-01 MED ORDER — METHOCARBAMOL 500 MG PO TABS
1000.0000 mg | ORAL_TABLET | Freq: Once | ORAL | Status: AC
  Administered 2015-04-01: 1000 mg via ORAL
  Filled 2015-04-01: qty 2

## 2015-04-01 MED ORDER — METHOCARBAMOL 500 MG PO TABS: 1000.0000 mg | ORAL_TABLET | Freq: Three times a day (TID) | ORAL | Status: DC | PRN

## 2015-04-01 MED ORDER — KETOROLAC TROMETHAMINE 60 MG/2ML IM SOLN
60.0000 mg | Freq: Once | INTRAMUSCULAR | Status: AC
  Administered 2015-04-01: 60 mg via INTRAMUSCULAR
  Filled 2015-04-01: qty 2

## 2015-04-01 NOTE — ED Notes (Signed)
Pt reports recent kidney stones. Having left lower flank pain-says there are intermittent sharp pains. Says she has had difficult urinating and reports some hematuria. Has had urinary frequency. Last time was able to pass kidney stones on her own. Says she felt a "pop" on the left side when she got up yesterday. Denies N/V/D. No other c/c.

## 2015-04-01 NOTE — ED Provider Notes (Signed)
CSN: [id number]     Arrival date & time 04/01/15  0052 History   First MD Initiated Contact with Patient 04/01/15 0131     Chief Complaint  Patient presents with  . Flank Pain    left  . Hematuria     (Consider location/radiation/quality/duration/timing/severity/associated sxs/prior Treatment) HPI Patient presents with left flank pain starting this afternoon while getting up from seated position. States she felt a pop in her back and has had persistent left flank pain since. Pain does not radiate. She has ongoing hematuria which she states is chronic. She complains of urinary frequency but denies dysuria. No nausea, vomiting or diarrhea. No fever or chills. No focal weakness or numbness. Past Medical History  Diagnosis Date  . Depression   . Epilepsy     last seizure age 24  . Migraine    Past Surgical History  Procedure Laterality Date  . Wisdom tooth extraction  Age 24   Family History  Problem Relation Age of Onset  . Diabetes Mother   . Hypertension Father    History  Substance Use Topics  . Smoking status: Never Smoker   . Smokeless tobacco: Never Used  . Alcohol Use: No   OB History    No data available     Review of Systems  Constitutional: Negative for fever and chills.  Respiratory: Negative for shortness of breath.   Cardiovascular: Negative for chest pain.  Gastrointestinal: Negative for nausea and abdominal pain.  Genitourinary: Positive for frequency, hematuria and flank pain. Negative for dysuria, vaginal bleeding, vaginal discharge and pelvic pain.  Musculoskeletal: Positive for myalgias and back pain.  Skin: Negative for pallor, rash and wound.  Neurological: Negative for dizziness, weakness, light-headedness and numbness.  All other systems reviewed and are negative.     Allergies  Review of patient's allergies indicates no known allergies.  Home Medications   Prior to Admission medications   Medication Sig Start Date End Date Taking?  Authorizing Provider  citalopram (CELEXA) 20 MG tablet Take 1 and 1/2 tab daily 09/20/14  Yes Cleotis NipperSyed T Arfeen, MD  risperiDONE (RISPERDAL) 1 MG tablet Take 1/2 to 1 tab at bed time 09/20/14  Yes Cleotis NipperSyed T Arfeen, MD  benzonatate (TESSALON) 100 MG capsule Take 1 capsule (100 mg total) by mouth 3 (three) times daily as needed for cough. Patient not taking: Reported on 04/01/2015 10/20/14   Mathis FareJennifer Lee H Presson, PA  cephALEXin (KEFLEX) 500 MG capsule Take 1 capsule (500 mg total) by mouth 2 (two) times daily. 04/01/15   Loren Raceravid Guillaume Weninger, MD  cetirizine (ZYRTEC) 10 MG tablet Take 1 tablet (10 mg total) by mouth daily. Patient not taking: Reported on 04/01/2015 02/10/15   Charm RingsErin J Honig, MD  ibuprofen (ADVIL,MOTRIN) 600 MG tablet Take 1 tablet (600 mg total) by mouth every 6 (six) hours as needed for moderate pain. 04/01/15   Loren Raceravid Osric Klopf, MD  ipratropium (ATROVENT) 0.06 % nasal spray Place 2 sprays into both nostrils 4 (four) times daily. For nasal congestion Patient not taking: Reported on 04/01/2015 02/10/15   Charm RingsErin J Honig, MD  methocarbamol (ROBAXIN) 500 MG tablet Take 2 tablets (1,000 mg total) by mouth every 8 (eight) hours as needed for muscle spasms. 04/01/15   Loren Raceravid Mattison Stuckey, MD   BP 114/73 mmHg  Pulse 72  Temp(Src) 99 F (37.2 C) (Oral)  Resp 18  Ht 5' 1.25" (1.556 m)  Wt 160 lb (72.576 kg)  BMI 29.98 kg/m2  SpO2 99%  LMP 03/27/2015 (Approximate)  Physical Exam  Constitutional: She is oriented to person, place, and time. She appears well-developed and well-nourished. No distress.  HENT:  Head: Normocephalic and atraumatic.  Mouth/Throat: Oropharynx is clear and moist.  Eyes: EOM are normal. Pupils are equal, round, and reactive to light.  Neck: Normal range of motion. Neck supple.  Cardiovascular: Normal rate and regular rhythm.   Pulmonary/Chest: Effort normal and breath sounds normal. No respiratory distress. She has no wheezes. She has no rales.  Abdominal: Soft. Bowel sounds are normal.  She exhibits no distension and no mass. There is no tenderness. There is no rebound and no guarding.  Musculoskeletal: Normal range of motion. She exhibits tenderness. She exhibits no edema.  Patient with tenderness to palpation over the left paraspinal lumbar musculature. There is no midline tenderness. No definite CVA tenderness.  Neurological: She is alert and oriented to person, place, and time.  5/5 motor in all extremities. Sensation is grossly intact.) Without difficulty.  Skin: Skin is warm and dry. No rash noted. No erythema.  Psychiatric: She has a normal mood and affect. Her behavior is normal.  Nursing note and vitals reviewed.   ED Course  Procedures (including critical care time) Labs Review Labs Reviewed  URINALYSIS, ROUTINE W REFLEX MICROSCOPIC - Abnormal; Notable for the following:    APPearance CLOUDY (*)    Hgb urine dipstick MODERATE (*)    Leukocytes, UA SMALL (*)    All other components within normal limits  URINE MICROSCOPIC-ADD ON - Abnormal; Notable for the following:    Squamous Epithelial / LPF FEW (*)    All other components within normal limits  PREGNANCY, URINE    Imaging Review No results found.   EKG Interpretation None      MDM   Final diagnoses:  Lumbar strain, initial encounter  UTI (lower urinary tract infection)  Hematuria   Patient with red blood cells and white blood cells seen in the urine. We'll treat for urinary tract infection. Low suspicion for pyelonephritis. This appears to be more muscular in nature. Treat symptomatically. Patient is advised to follow-up with her primary physician.     Loren Raceravid Ilyse Tremain, MD 04/01/15 [phone]

## 2015-04-01 NOTE — ED Notes (Signed)
Pt reports left flank pain has decreased to 7/10.

## 2015-04-01 NOTE — Discharge Instructions (Signed)
Muscle Strain A muscle strain is an injury that occurs when a muscle is stretched beyond its normal length. Usually a small number of muscle fibers are torn when this happens. Muscle strain is rated in degrees. First-degree strains have the least amount of muscle fiber tearing and pain. Second-degree and third-degree strains have increasingly more tearing and pain.  Usually, recovery from muscle strain takes 1-2 weeks. Complete healing takes 5-6 weeks.  CAUSES  Muscle strain happens when a sudden, violent force placed on a muscle stretches it too far. This may occur with lifting, sports, or a fall.  RISK FACTORS Muscle strain is especially common in athletes.  SIGNS AND SYMPTOMS At the site of the muscle strain, there may be:  Pain.  Bruising.  Swelling.  Difficulty using the muscle due to pain or lack of normal function. DIAGNOSIS  Your health care provider will perform a physical exam and ask about your medical history. TREATMENT  Often, the best treatment for a muscle strain is resting, icing, and applying cold compresses to the injured area.  HOME CARE INSTRUCTIONS   Use the PRICE method of treatment to promote muscle healing during the first 2-3 days after your injury. The PRICE method involves:  Protecting the muscle from being injured again.  Restricting your activity and resting the injured body part.  Icing your injury. To do this, put ice in a plastic bag. Place a towel between your skin and the bag. Then, apply the ice and leave it on from 15-20 minutes each hour. After the third day, switch to moist heat packs.  Apply compression to the injured area with a splint or elastic bandage. Be careful not to wrap it too tightly. This may interfere with blood circulation or increase swelling.  Elevate the injured body part above the level of your heart as often as you can.  Only take over-the-counter or prescription medicines for pain, discomfort, or fever as directed by your  health care provider.  Warming up prior to exercise helps to prevent future muscle strains. SEEK MEDICAL CARE IF:   You have increasing pain or swelling in the injured area.  You have numbness, tingling, or a significant loss of strength in the injured area. MAKE SURE YOU:   Understand these instructions.  Will watch your condition.  Will get help right away if you are not doing well or get worse. Document Released: 10/29/2005 Document Revised: 08/19/2013 Document Reviewed: 05/28/2013 [facility] Patient Information 2015 [place], [place]. This information is not intended to replace advice given to you by your health care provider. Make sure you discuss any questions you have with your health care provider.  Hematuria Hematuria is blood in your urine. It can be caused by a bladder infection, kidney infection, prostate infection, kidney stone, or cancer of your urinary tract. Infections can usually be treated with medicine, and a kidney stone usually will pass through your urine. If neither of these is the cause of your hematuria, further workup to find out the reason may be needed. It is very important that you tell your health care provider about any blood you see in your urine, even if the blood stops without treatment or happens without causing pain. Blood in your urine that happens and then stops and then happens again can be a symptom of a very serious condition. Also, pain is not a symptom in the initial stages of many urinary cancers. HOME CARE INSTRUCTIONS   Drink lots of fluid, 3-4 quarts a day. If  you have been diagnosed with an infection, cranberry juice is especially recommended, in addition to large amounts of water.  Avoid caffeine, tea, and carbonated beverages because they tend to irritate the bladder.  Avoid alcohol because it may irritate the prostate.  Take all medicines as directed by your health care provider.  If you were prescribed an antibiotic medicine, finish it  all even if you start to feel better.  If you have been diagnosed with a kidney stone, follow your health care provider's instructions regarding straining your urine to catch the stone.  Empty your bladder often. Avoid holding urine for long periods of time.  After a bowel movement, women should cleanse front to back. Use each tissue only once.  Empty your bladder before and after sexual intercourse if you are a female. SEEK MEDICAL CARE IF:  You develop back pain.  You have a fever.  You have a feeling of sickness in your stomach (nausea) or vomiting.  Your symptoms are not better in 3 days. Return sooner if you are getting worse. SEEK IMMEDIATE MEDICAL CARE IF:   You develop severe vomiting and are unable to keep the medicine down.  You develop severe back or abdominal pain despite taking your medicines.  You begin passing a large amount of blood or clots in your urine.  You feel extremely weak or faint, or you pass out. MAKE SURE YOU:   Understand these instructions.  Will watch your condition.  Will get help right away if you are not doing well or get worse. Document Released: 10/29/2005 Document Revised: 03/15/2014 Document Reviewed: 06/29/2013 [facility] Patient Information 2015 [place], [place]. This information is not intended to replace advice given to you by your health care provider. Make sure you discuss any questions you have with your health care provider.  Urinary Tract Infection Urinary tract infections (UTIs) can develop anywhere along your urinary tract. Your urinary tract is your body's drainage system for removing wastes and extra water. Your urinary tract includes two kidneys, two ureters, a bladder, and a urethra. Your kidneys are a pair of bean-shaped organs. Each kidney is about the size of your fist. They are located below your ribs, one on each side of your spine. CAUSES Infections are caused by microbes, which are microscopic organisms, including fungi,  viruses, and bacteria. These organisms are so small that they can only be seen through a microscope. Bacteria are the microbes that most commonly cause UTIs. SYMPTOMS  Symptoms of UTIs may vary by age and gender of the patient and by the location of the infection. Symptoms in young women typically include a frequent and intense urge to urinate and a painful, burning feeling in the bladder or urethra during urination. Older women and men are more likely to be tired, shaky, and weak and have muscle aches and abdominal pain. A fever may mean the infection is in your kidneys. Other symptoms of a kidney infection include pain in your back or sides below the ribs, nausea, and vomiting. DIAGNOSIS To diagnose a UTI, your caregiver will ask you about your symptoms. Your caregiver also will ask to provide a urine sample. The urine sample will be tested for bacteria and white blood cells. White blood cells are made by your body to help fight infection. TREATMENT  Typically, UTIs can be treated with medication. Because most UTIs are caused by a bacterial infection, they usually can be treated with the use of antibiotics. The choice of antibiotic and length of treatment depend  on your symptoms and the type of bacteria causing your infection. HOME CARE INSTRUCTIONS  If you were prescribed antibiotics, take them exactly as your caregiver instructs you. Finish the medication even if you feel better after you have only taken some of the medication.  Drink enough water and fluids to keep your urine clear or pale yellow.  Avoid caffeine, tea, and carbonated beverages. They tend to irritate your bladder.  Empty your bladder often. Avoid holding urine for long periods of time.  Empty your bladder before and after sexual intercourse.  After a bowel movement, women should cleanse from front to back. Use each tissue only once. SEEK MEDICAL CARE IF:   You have back pain.  You develop a fever.  Your symptoms do not  begin to resolve within 3 days. SEEK IMMEDIATE MEDICAL CARE IF:   You have severe back pain or lower abdominal pain.  You develop chills.  You have nausea or vomiting.  You have continued burning or discomfort with urination. MAKE SURE YOU:   Understand these instructions.  Will watch your condition.  Will get help right away if you are not doing well or get worse. Document Released: 08/08/2005 Document Revised: 04/29/2012 Document Reviewed: 12/07/2011 [facility]Exit[facility] Patient Information 2015 [place], [place]. This information is not intended to replace advice given to you by your health care provider. Make sure you discuss any questions you have with your health care provider.

## 2015-12-19 ENCOUNTER — Encounter (HOSPITAL_COMMUNITY): Payer: Self-pay | Admitting: Emergency Medicine

## 2015-12-19 ENCOUNTER — Emergency Department (HOSPITAL_COMMUNITY)
Admission: EM | Admit: 2015-12-19 | Discharge: 2015-12-19 | Disposition: A | Discharge status: Home / Self Care | Payer: Self-pay | Attending: Emergency Medicine | Admitting: Emergency Medicine

## 2015-12-19 DIAGNOSIS — Z8669 Personal history of other diseases of the nervous system and sense organs: Secondary | ICD-10-CM | POA: Insufficient documentation

## 2015-12-19 DIAGNOSIS — T781XXA Other adverse food reactions, not elsewhere classified, initial encounter: Secondary | ICD-10-CM | POA: Insufficient documentation

## 2015-12-19 DIAGNOSIS — Y9289 Other specified places as the place of occurrence of the external cause: Secondary | ICD-10-CM | POA: Insufficient documentation

## 2015-12-19 DIAGNOSIS — Z79899 Other long term (current) drug therapy: Secondary | ICD-10-CM | POA: Insufficient documentation

## 2015-12-19 DIAGNOSIS — R51 Headache: Secondary | ICD-10-CM | POA: Insufficient documentation

## 2015-12-19 DIAGNOSIS — F329 Major depressive disorder, single episode, unspecified: Secondary | ICD-10-CM | POA: Insufficient documentation

## 2015-12-19 DIAGNOSIS — H578 Other specified disorders of eye and adnexa: Secondary | ICD-10-CM | POA: Insufficient documentation

## 2015-12-19 DIAGNOSIS — Y9389 Activity, other specified: Secondary | ICD-10-CM | POA: Insufficient documentation

## 2015-12-19 DIAGNOSIS — X58XXXA Exposure to other specified factors, initial encounter: Secondary | ICD-10-CM | POA: Insufficient documentation

## 2015-12-19 DIAGNOSIS — Y998 Other external cause status: Secondary | ICD-10-CM | POA: Insufficient documentation

## 2015-12-19 DIAGNOSIS — J3489 Other specified disorders of nose and nasal sinuses: Secondary | ICD-10-CM | POA: Insufficient documentation

## 2015-12-19 MED ORDER — DIPHENHYDRAMINE HCL 25 MG PO CAPS
25.0000 mg | ORAL_CAPSULE | Freq: Once | ORAL | Status: AC
  Administered 2015-12-19: 25 mg via ORAL
  Filled 2015-12-19: qty 1

## 2015-12-19 MED ORDER — ACETAMINOPHEN 325 MG PO TABS
650.0000 mg | ORAL_TABLET | Freq: Once | ORAL | Status: AC
  Administered 2015-12-19: 650 mg via ORAL
  Filled 2015-12-19: qty 2

## 2015-12-19 NOTE — ED Notes (Signed)
No edema noted to eyes bilat

## 2015-12-19 NOTE — Discharge Instructions (Signed)
Allergies An allergy is when your body reacts to a substance in a way that is not normal. An allergic reaction can happen after you:  Eat something.  Breathe in something.  Touch something. WHAT KINDS OF ALLERGIES ARE THERE? You can be allergic to:  Things that are only around during certain seasons, like molds and pollens.  Foods.  Drugs.  Insects.  Animal dander. WHAT ARE SYMPTOMS OF ALLERGIES?  Puffiness (swelling). This may happen on the lips, face, tongue, mouth, or throat.  Sneezing.  Coughing.  Breathing loudly (wheezing).  Stuffy nose.  Tingling in the mouth.  A rash.  Itching.  Itchy, red, puffy areas of skin (hives).  Watery eyes.  Throwing up (vomiting).  Watery poop (diarrhea).  Dizziness.  Feeling faint or fainting.  Trouble breathing or swallowing.  A tight feeling in the chest.  A fast heartbeat. HOW ARE ALLERGIES DIAGNOSED? Allergies can be diagnosed with:  A medical and family history.  Skin tests.  Blood tests.  A food diary. A food diary is a record of all the foods, drinks, and symptoms you have each day.  The results of an elimination diet. This diet involves making sure not to eat certain foods and then seeing what happens when you start eating them again. HOW ARE ALLERGIES TREATED? There is no cure for allergies, but allergic reactions can be treated with medicine. Severe reactions usually need to be treated at a hospital.  HOW CAN REACTIONS BE PREVENTED? The best way to prevent an allergic reaction is to avoid the thing you are allergic to. Allergy shots and medicines can also help prevent reactions in some cases.   This information is not intended to replace advice given to you by your health care provider. Make sure you discuss any questions you have with your health care provider.   Document Released: 02/23/2013 Document Revised: 11/19/2014 Document Reviewed: 08/10/2014 Elsevier Interactive Patient Education [employer]. If you develop any of the signs below you should come to the ED   Signs of acute allergic reaction  SHORTNESS OF BREATH DIFFICULTY SWALLOWING  RAPID HEART BEAT ABDOMINAL PAIN  NAUSEA AND VOMITING

## 2015-12-19 NOTE — ED Notes (Signed)
Pt states that after she ate tonight she started having running eyes, nose and a headache. She thinks she was allergic to the spices in the food. Alert and oriented. Airway intact.

## 2015-12-19 NOTE — ED Provider Notes (Signed)
CSN: [id number]     Arrival date & time 12/19/15  2020 History  By signing my name below, I, Phillis Haggis, attest that this documentation has been prepared under the direction and in the presence of Earley Favor, NP-C. Electronically Signed: Phillis Haggis, ED Scribe. 12/19/2015. 10:12 PM.   Chief Complaint  Patient presents with  . Allergic Reaction   The history is provided by the patient. No language interpreter was used.  HPI Comments: Brianna Cordova is a 25 y.o. female who presents to the Emergency Department complaining of a possible allergic reaction onset 2 hours ago. Pt states that she had spices on her dinner tonight that then caused her eyes to swell with tearing, rhinorrhea and headache. Significant other states that he used lemon pepper in the food, and pt is allergic to black pepper. She states that exposure to black pepper causes oropharyngeal swelling. Pt has not tried anything for her symptoms. She denies oropharyngeal swelling, trouble swallowing, or SOB. LMP 11/28/15.  Past Medical History  Diagnosis Date  . Depression   . Epilepsy (HCC)     last seizure age 41  . Migraine    Past Surgical History  Procedure Laterality Date  . Wisdom tooth extraction  Age 74   Family History  Problem Relation Age of Onset  . Diabetes Mother   . Hypertension Father    Social History  Substance Use Topics  . Smoking status: Never Smoker   . Smokeless tobacco: Never Used  . Alcohol Use: No   OB History    No data available     Review of Systems  HENT: Positive for rhinorrhea.   Eyes: Positive for discharge.  Musculoskeletal: Negative for joint swelling.  Skin: Negative for wound.  Neurological: Positive for headaches.  All other systems reviewed and are negative.  Allergies  Black pepper  Home Medications   Prior to Admission medications   Medication Sig Start Date End Date Taking? Authorizing Provider  citalopram (CELEXA) 20 MG tablet Take 1 and 1/2 tab daily  09/20/14  Yes Cleotis Nipper, MD  risperiDONE (RISPERDAL) 1 MG tablet Take 1/2 to 1 tab at bed time 09/20/14  Yes Cleotis Nipper, MD  cetirizine (ZYRTEC) 10 MG tablet Take 1 tablet (10 mg total) by mouth daily. Patient not taking: Reported on 04/01/2015 02/10/15   Charm Rings, MD   BP 122/88 mmHg  Pulse 66  Temp(Src) 98.3 F (36.8 C) (Oral)  Resp 15  Ht  (1.549 m)  Wt 81.194 kg  BMI 33.84 kg/m2  SpO2 99%  LMP 11/28/2015 (Exact Date) Physical Exam  Constitutional: She appears well-developed and well-nourished.  HENT:  Head: Normocephalic.  Mouth/Throat: Oropharynx is clear and moist.  Eyes: Pupils are equal, round, and reactive to light.  Neck: Normal range of motion.  Cardiovascular: Normal rate.   Pulmonary/Chest: Effort normal. She has no wheezes.  Musculoskeletal: Normal range of motion. She exhibits no edema or tenderness.  Lymphadenopathy:    She has no cervical adenopathy.  Nursing note and vitals reviewed.   ED Course  Procedures (including critical care time) DIAGNOSTIC STUDIES: Oxygen Saturation is 99% on RA, normal by my interpretation.    COORDINATION OF CARE: 10:12 PM-Discussed treatment plan which includes Tylenol and Benadryl with pt at bedside and pt agreed to plan.   Labs Review Labs Reviewed - No data to display  Imaging Review No results found. I have personally reviewed and evaluated these images and lab results as  part of my medical decision-making.   EKG Interpretation None     No respiratory distress  MDM   Final diagnoses:  Allergic reaction to food    I personally performed the services described in this documentation, which was scribed in my presence. The recorded information has been reviewed and is accurate.   Earley Favor, NP 12/19/15 2228  Arby Barrette, MD 12/20/15 [phone]

## 2016-05-30 ENCOUNTER — Encounter (HOSPITAL_COMMUNITY): Payer: Self-pay | Admitting: *Deleted

## 2016-05-30 ENCOUNTER — Emergency Department (HOSPITAL_COMMUNITY)
Admission: EM | Admit: 2016-05-30 | Discharge: 2016-05-30 | Disposition: A | Discharge status: Home / Self Care | Payer: Self-pay | Attending: Emergency Medicine | Admitting: Emergency Medicine

## 2016-05-30 ENCOUNTER — Ambulatory Visit: Payer: Self-pay

## 2016-05-30 DIAGNOSIS — K529 Noninfective gastroenteritis and colitis, unspecified: Secondary | ICD-10-CM | POA: Insufficient documentation

## 2016-05-30 DIAGNOSIS — F329 Major depressive disorder, single episode, unspecified: Secondary | ICD-10-CM | POA: Insufficient documentation

## 2016-05-30 LAB — COMPREHENSIVE METABOLIC PANEL
ALT: 19 U/L (ref 14–54)
AST: 20 U/L (ref 15–41)
Albumin: 4.8 g/dL (ref 3.5–5.0)
Alkaline Phosphatase: 70 U/L (ref 38–126)
Anion gap: 6 (ref 5–15)
BUN: 9 mg/dL (ref 6–20)
CHLORIDE: 102 mmol/L (ref 101–111)
CO2: 27 mmol/L (ref 22–32)
Calcium: 9.7 mg/dL (ref 8.9–10.3)
Creatinine, Ser: 0.97 mg/dL (ref 0.44–1.00)
GFR calc Af Amer: >60 mL/min (ref >60)
Glucose, Bld: 118 mg/dL — ABNORMAL HIGH (ref 65–99)
POTASSIUM: 3.8 mmol/L (ref 3.5–5.1)
SODIUM: 135 mmol/L (ref 135–145)
TOTAL PROTEIN: 8.5 g/dL — AB (ref 6.5–8.1)
Total Bilirubin: 0.8 mg/dL (ref 0.3–1.2)

## 2016-05-30 LAB — CBC
HEMATOCRIT: 38.7 % (ref 36.0–46.0)
Hemoglobin: 12.8 g/dL (ref 12.0–15.0)
MCH: 25.7 pg — ABNORMAL LOW (ref 26.0–34.0)
MCHC: 33.1 g/dL (ref 30.0–36.0)
MCV: 77.6 fL — ABNORMAL LOW (ref 78.0–100.0)
PLATELETS: 284 10*3/uL (ref 150–400)
RBC: 4.99 MIL/uL (ref 3.87–5.11)
RDW: 12.8 % (ref 11.5–15.5)
WBC: 7.8 10*3/uL (ref 4.0–10.5)

## 2016-05-30 LAB — I-STAT BETA HCG BLOOD, ED (MC, WL, AP ONLY): I-stat hCG, quantitative: <5 m[IU]/mL (ref <5)

## 2016-05-30 LAB — LIPASE, BLOOD: LIPASE: 27 U/L (ref 11–51)

## 2016-05-30 MED ORDER — ONDANSETRON HCL 4 MG PO TABS: 4.0000 mg | ORAL_TABLET | Freq: Three times a day (TID) | ORAL | Status: DC | PRN

## 2016-05-30 MED ORDER — ONDANSETRON 4 MG PO TBDP
4.0000 mg | ORAL_TABLET | Freq: Once | ORAL | Status: AC
  Administered 2016-05-30: 4 mg via ORAL
  Filled 2016-05-30: qty 1

## 2016-05-30 NOTE — ED Notes (Signed)
PT DISCHARGED. INSTRUCTIONS AND PRESCRIPTION GIVEN. AAOX4. PT IN NO APPARENT DISTRESS OR PAIN. THE OPPORTUNITY TO ASK QUESTIONS WAS PROVIDED. 

## 2016-05-30 NOTE — ED Notes (Signed)
Called twice not response.

## 2016-05-30 NOTE — ED Provider Notes (Signed)
CSN: [id number]     Arrival date & time 05/30/16  1411 History   First MD Initiated Contact with Patient 05/30/16 1636     Chief Complaint  Patient presents with  . Diarrhea  . Emesis     HPI Comments: She was at work yesterday and multiple episodes.  She had to leave early this morning from work at around 1 am.  She works third shift.  That has decreased and she has not vomited today.  She still has some loose stools but that has decreased.  She doesn't have any pain.  No fevers.  No recent travel.  No abx use.  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  She is in the heat a lot and is worried if she tries to eat or drink a lot the sx will return.  Patient is a 25 y.o. female presenting with diarrhea and vomiting.  Diarrhea Severity:  Moderate Duration: It started on monday. Associated symptoms: vomiting   Vomiting:    Vomiting duration: it started on tuesday. Emesis Associated symptoms: diarrhea     Past Medical History  Diagnosis Date  . Depression   . Epilepsy (HCC)     last seizure age 30  . Migraine    Past Surgical History  Procedure Laterality Date  . Wisdom tooth extraction  Age 69   Family History  Problem Relation Age of Onset  . Diabetes Mother   . Hypertension Father    Social History  Substance Use Topics  . Smoking status: Never Smoker   . Smokeless tobacco: Never Used  . Alcohol Use: No   OB History    No data available     Review of Systems  Gastrointestinal: Positive for vomiting and diarrhea.  All other systems reviewed and are negative.     Allergies  Black pepper  Home Medications   Prior to Admission medications   Medication Sig Start Date End Date Taking? Authorizing Provider  cetirizine (ZYRTEC) 10 MG tablet Take 1 tablet (10 mg total) by mouth daily. Patient not taking: Reported on 04/01/2015 02/10/15   Charm Rings, MD  citalopram (CELEXA) 20 MG tablet Take 1 and 1/2 tab daily 09/20/14   Cleotis Nipper, MD  ondansetron (ZOFRAN)  4 MG tablet Take 1 tablet (4 mg total) by mouth every 8 (eight) hours as needed for nausea or vomiting. 05/30/16   Linwood Dibbles, MD  risperiDONE (RISPERDAL) 1 MG tablet Take 1/2 to 1 tab at bed time 09/20/14   Cleotis Nipper, MD   BP 132/85 mmHg  Pulse 79  Temp(Src) 98.5 F (36.9 C) (Oral)  Resp 18  Ht  (1.549 m)  Wt 93.441 kg  BMI 38.94 kg/m2  SpO2 98%  LMP 05/14/2016 Physical Exam  Constitutional: She appears well-developed and well-nourished. No distress.  HENT:  Head: Normocephalic and atraumatic.  Right Ear: External ear normal.  Left Ear: External ear normal.  Eyes: Conjunctivae are normal. Right eye exhibits no discharge. Left eye exhibits no discharge. No scleral icterus.  Neck: Neck supple. No tracheal deviation present.  Cardiovascular: Normal rate, regular rhythm and intact distal pulses.   Pulmonary/Chest: Effort normal and breath sounds normal. No stridor. No respiratory distress. She has no wheezes. She has no rales.  Abdominal: Soft. Bowel sounds are normal. She exhibits no distension. There is no tenderness. There is no rebound and no guarding.  Musculoskeletal: She exhibits no edema or tenderness.  Neurological: She is alert.  She has normal strength. No cranial nerve deficit (no facial droop, extraocular movements intact, no slurred speech) or sensory deficit. She exhibits normal muscle tone. She displays no seizure activity. Coordination normal.  Skin: Skin is warm and dry. No rash noted.  Psychiatric: She has a normal mood and affect.  Nursing note and vitals reviewed.   ED Course  Procedures (including critical care time) Labs Review Labs Reviewed  COMPREHENSIVE METABOLIC PANEL - Abnormal; Notable for the following:    Glucose, Bld 118 (*)    Total Protein 8.5 (*)    All other components within normal limits  CBC - Abnormal; Notable for the following:    MCV 77.6 (*)    MCH 25.7 (*)    All other components within normal limits  LIPASE, BLOOD  I-STAT BETA  HCG BLOOD, ED ([facility], [facility], [facility] ONLY)    MDM   Final diagnoses:  Gastroenteritis    Patient's symptoms are suggestive of viral gastroenteritis. Her symptoms have been improving. She has not had any vomiting today. She only had one loose stool. I'll discharge her home with a prescription for Zofran. I'll have her follow up with her primary care doctor. Return for fever, pain, worsening symptoms    Linwood DibblesJon Makenzie Weisner, MD 05/30/16 1801

## 2016-05-30 NOTE — Discharge Instructions (Signed)

## 2016-05-30 NOTE — ED Notes (Signed)
Pt reports diarrhea and vomiting x 5 days.  Pt also reports upper abd pain.

## 2016-06-28 ENCOUNTER — Emergency Department (HOSPITAL_COMMUNITY): Payer: No Typology Code available for payment source

## 2016-06-28 ENCOUNTER — Emergency Department (HOSPITAL_COMMUNITY)
Admission: EM | Admit: 2016-06-28 | Discharge: 2016-06-28 | Disposition: A | Discharge status: Home / Self Care | Payer: No Typology Code available for payment source | Attending: Emergency Medicine | Admitting: Emergency Medicine

## 2016-06-28 ENCOUNTER — Encounter (HOSPITAL_COMMUNITY): Payer: Self-pay | Admitting: Emergency Medicine

## 2016-06-28 DIAGNOSIS — M5431 Sciatica, right side: Secondary | ICD-10-CM | POA: Insufficient documentation

## 2016-06-28 DIAGNOSIS — M79604 Pain in right leg: Secondary | ICD-10-CM

## 2016-06-28 DIAGNOSIS — Z79899 Other long term (current) drug therapy: Secondary | ICD-10-CM | POA: Insufficient documentation

## 2016-06-28 DIAGNOSIS — M79661 Pain in right lower leg: Secondary | ICD-10-CM | POA: Insufficient documentation

## 2016-06-28 LAB — POC URINE PREG, ED: Preg Test, Ur: NEGATIVE

## 2016-06-28 MED ORDER — CYCLOBENZAPRINE HCL 10 MG PO TABS: 10.0000 mg | ORAL_TABLET | Freq: Three times a day (TID) | ORAL | 0 refills | Status: DC | PRN

## 2016-06-28 MED ORDER — KETOROLAC TROMETHAMINE 60 MG/2ML IM SOLN
60.0000 mg | Freq: Once | INTRAMUSCULAR | Status: AC
  Administered 2016-06-28: 60 mg via INTRAMUSCULAR
  Filled 2016-06-28: qty 2

## 2016-06-28 MED ORDER — IBUPROFEN 600 MG PO TABS
600.0000 mg | ORAL_TABLET | Freq: Three times a day (TID) | ORAL | 0 refills | Status: DC | PRN
Start: 2016-06-28 — End: 2020-05-07

## 2016-06-28 MED ORDER — OXYCODONE-ACETAMINOPHEN 5-325 MG PO TABS
1.0000 | ORAL_TABLET | Freq: Once | ORAL | Status: AC
  Administered 2016-06-28: 1 via ORAL
  Filled 2016-06-28: qty 1

## 2016-06-28 NOTE — ED Triage Notes (Signed)
Patient here from home with complaints of right side thigh pain that started yesterday. Reports not being able to walk, ambulatory in triage. Pain 10/10

## 2016-06-28 NOTE — ED Notes (Signed)
Bed: WA13 Expected date:  Expected time:  Means of arrival:  Comments: 

## 2016-06-28 NOTE — ED Provider Notes (Signed)
[facility]-EMERGENCY DEPT Provider Note   CSN: [id number] Arrival date & time: 06/28/16  0546     History   Chief Complaint Chief Complaint  Patient presents with  . Leg Pain    HPI Brianna Cordova is a 25 y.o. female.  The history is provided by the patient and medical records.   Patient presents with increasing right hip and right lateral thigh pain over the past 24 hours.  She does report that she does heavy lifting at work but doesn't remember doing any significant heavy lifting that would've caused this discomfort or pain.  She's not tried any medication prior to arrival.  Her pain in her right lateral thigh is moderate in severity worse with range of motion of her right hip worse with ambulating.  No fevers or chills.  Denies abdominal pain.  Denies nausea vomiting.   Past Medical History:  Diagnosis Date  . Depression   . Epilepsy (HCC)    last seizure age 25  . Migraine     Patient Active Problem List   Diagnosis Date Noted  . Unspecified episodic mood disorder 02/18/2013  . MDD (major depressive disorder), single episode, severe with psychotic features (HCC) 07/18/2012    Class: Acute  . Borderline behavior 07/18/2012    Class: Chronic    Past Surgical History:  Procedure Laterality Date  . WISDOM TOOTH EXTRACTION  Age 25    OB History    No data available       Home Medications    Prior to Admission medications   Medication Sig Start Date End Date Taking? Authorizing Provider  cetirizine (ZYRTEC) 10 MG tablet Take 1 tablet (10 mg total) by mouth daily. Patient not taking: Reported on 04/01/2015 02/10/15   Charm RingsErin J Honig, MD  citalopram (CELEXA) 20 MG tablet Take 1 and 1/2 tab daily 09/20/14   Cleotis NipperSyed T Arfeen, MD  cyclobenzaprine (FLEXERIL) 10 MG tablet Take 1 tablet (10 mg total) by mouth 3 (three) times daily as needed for muscle spasms. 06/28/16   Azalia BilisKevin Shaunita Seney, MD  ibuprofen (ADVIL,MOTRIN) 600 MG tablet Take 1 tablet (600 mg total) by mouth every 8 (eight)  hours as needed. 06/28/16   Azalia BilisKevin Alexie Samson, MD  ondansetron (ZOFRAN) 4 MG tablet Take 1 tablet (4 mg total) by mouth every 8 (eight) hours as needed for nausea or vomiting. 05/30/16   Linwood DibblesJon Knapp, MD  risperiDONE (RISPERDAL) 1 MG tablet Take 1/2 to 1 tab at bed time 09/20/14   Cleotis NipperSyed T Arfeen, MD    Family History Family History  Problem Relation Age of Onset  . Diabetes Mother   . Hypertension Father     Social History Social History  Substance Use Topics  . Smoking status: Never Smoker  . Smokeless tobacco: Never Used  . Alcohol use No     Allergies   Black pepper [piper]   Review of Systems Review of Systems  All other systems reviewed and are negative.    Physical Exam Updated Vital Signs BP 153/96 (BP Location: Left Arm)   Pulse 77   Temp 98.8 F (37.1 C) (Oral)   Resp 18   LMP 06/12/2016   SpO2 100%   Physical Exam  Constitutional: She is oriented to person, place, and time. She appears well-developed and well-nourished.  HENT:  Head: Normocephalic.  Eyes: EOM are normal.  Neck: Normal range of motion.  Pulmonary/Chest: Effort normal.  Abdominal: She exhibits no distension.  Musculoskeletal:  Painful range of motion of right  hip.  Palpation of right lateral thigh demonstrates no abnormalities.  No rash or skin changes of right lateral thigh.  Normal PT and DP pulse in right foot.  Full range of motion of right ankle and right knee.  No unilateral leg swelling of the right as compared to the left.  No L-spine tenderness  Neurological: She is alert and oriented to person, place, and time.  Psychiatric: She has a normal mood and affect.  Nursing note and vitals reviewed.    ED Treatments / Results  Labs (all labs ordered are listed, but only abnormal results are displayed) Labs Reviewed  POC URINE PREG, ED    EKG  EKG Interpretation None       Radiology Dg Hip Unilat W Or Wo Pelvis 2-3 Views Right  Result Date: 06/28/2016 CLINICAL DATA:  Worsening  generalized right hip pain over the past week with no known trauma. Patient unable to bear weight on the right. EXAM: DG HIP (WITH OR WITHOUT PELVIS) 2-3V RIGHT COMPARISON:  Coronal and sagittal reconstructed CT images from an abdominal CT scan of August 2015 FINDINGS: The bony pelvis is subjectively adequately mineralized. There is no lytic nor blastic lesion. No acute pelvic fracture is observed. AP and lateral views of the right hip reveal preservation of the joint space. The articular surfaces of the femoral head and acetabulum are smoothly rounded. The femoral neck, intertrochanteric, and subtrochanteric regions are normal. IMPRESSION: There is no acute or significant chronic bony abnormality of the lumbar spine. On the previous CT scan bulging disc material at L5-S1 was observed. If the patient's symptoms are felt to be radicular in nature, MRI of the lumbar spine may be useful. Electronically Signed   By: David  SwazilandJordan M.D.   On: 06/28/2016 08:31    Procedures Procedures (including critical care time)  Medications Ordered in ED Medications  ketorolac (TORADOL) injection 60 mg (60 mg Intramuscular Given 06/28/16 0802)  oxyCODONE-acetaminophen (PERCOCET/ROXICET) 5-325 MG per tablet 1 tablet (1 tablet Oral Given 06/28/16 0801)     Initial Impression / Assessment and Plan / ED Course  I have reviewed the triage vital signs and the nursing notes.  Pertinent labs & imaging results that were available during my care of the patient were reviewed by me and considered in my medical decision making (see chart for details).  Clinical Course  Value Comment By Time  DG Hip Unilat W or Wo Pelvis 2-3 Views Right (Reviewed) Azalia BilisKevin Winfrey Chillemi, MD 08/17 0827    suspect musculoskeletal pain.  Patient be treated with anti-inflammatories and muscle relaxants.  Primary care follow-up.  Final Clinical Impressions(s) / ED Diagnoses   Final diagnoses:  Right leg pain  Sciatica of right side    New  Prescriptions New Prescriptions   CYCLOBENZAPRINE (FLEXERIL) 10 MG TABLET    Take 1 tablet (10 mg total) by mouth 3 (three) times daily as needed for muscle spasms.   IBUPROFEN (ADVIL,MOTRIN) 600 MG TABLET    Take 1 tablet (600 mg total) by mouth every 8 (eight) hours as needed.     Azalia BilisKevin Jarnell Cordaro, MD 06/28/16 [phone]

## 2016-08-20 ENCOUNTER — Encounter (HOSPITAL_COMMUNITY): Payer: Self-pay | Admitting: Emergency Medicine

## 2016-08-20 ENCOUNTER — Emergency Department (HOSPITAL_COMMUNITY)
Admission: EM | Admit: 2016-08-20 | Discharge: 2016-08-20 | Disposition: A | Discharge status: Home / Self Care | Payer: No Typology Code available for payment source | Attending: Emergency Medicine | Admitting: Emergency Medicine

## 2016-08-20 ENCOUNTER — Emergency Department (HOSPITAL_COMMUNITY): Payer: No Typology Code available for payment source

## 2016-08-20 DIAGNOSIS — N1330 Unspecified hydronephrosis: Secondary | ICD-10-CM | POA: Insufficient documentation

## 2016-08-20 DIAGNOSIS — N200 Calculus of kidney: Secondary | ICD-10-CM

## 2016-08-20 DIAGNOSIS — N23 Unspecified renal colic: Secondary | ICD-10-CM

## 2016-08-20 LAB — COMPREHENSIVE METABOLIC PANEL
ALBUMIN: 4.7 g/dL (ref 3.5–5.0)
ALK PHOS: 56 U/L (ref 38–126)
ALT: 18 U/L (ref 14–54)
AST: 22 U/L (ref 15–41)
Anion gap: 8 (ref 5–15)
BUN: 10 mg/dL (ref 6–20)
CALCIUM: 9.5 mg/dL (ref 8.9–10.3)
CHLORIDE: 109 mmol/L (ref 101–111)
CO2: 23 mmol/L (ref 22–32)
CREATININE: 1.14 mg/dL — AB (ref 0.44–1.00)
GFR calc Af Amer: >60 mL/min (ref >60)
GFR calc non Af Amer: >60 mL/min (ref >60)
GLUCOSE: 111 mg/dL — AB (ref 65–99)
Potassium: 3.1 mmol/L — ABNORMAL LOW (ref 3.5–5.1)
SODIUM: 140 mmol/L (ref 135–145)
Total Bilirubin: 0.8 mg/dL (ref 0.3–1.2)
Total Protein: 8.2 g/dL — ABNORMAL HIGH (ref 6.5–8.1)

## 2016-08-20 LAB — URINALYSIS, ROUTINE W REFLEX MICROSCOPIC
Bilirubin Urine: NEGATIVE
Glucose, UA: NEGATIVE mg/dL
Hgb urine dipstick: NEGATIVE
Ketones, ur: 15 mg/dL — AB
NITRITE: NEGATIVE
PH: 6 (ref 5.0–8.0)
Protein, ur: NEGATIVE mg/dL
SPECIFIC GRAVITY, URINE: 1.023 (ref 1.005–1.030)

## 2016-08-20 LAB — CBC WITH DIFFERENTIAL/PLATELET
BASOS PCT: 0 %
Basophils Absolute: 0 10*3/uL (ref 0.0–0.1)
EOS ABS: 0 10*3/uL (ref 0.0–0.7)
Eosinophils Relative: 0 %
HEMATOCRIT: 34.3 % — AB (ref 36.0–46.0)
HEMOGLOBIN: 11.8 g/dL — AB (ref 12.0–15.0)
LYMPHS ABS: 1.4 10*3/uL (ref 0.7–4.0)
Lymphocytes Relative: 11 %
MCH: 25.9 pg — AB (ref 26.0–34.0)
MCHC: 34.4 g/dL (ref 30.0–36.0)
MCV: 75.4 fL — AB (ref 78.0–100.0)
MONO ABS: 0.7 10*3/uL (ref 0.1–1.0)
MONOS PCT: 6 %
Neutro Abs: 10.4 10*3/uL — ABNORMAL HIGH (ref 1.7–7.7)
Neutrophils Relative %: 83 %
Platelets: 249 10*3/uL (ref 150–400)
RBC: 4.55 MIL/uL (ref 3.87–5.11)
RDW: 13.5 % (ref 11.5–15.5)
WBC: 12.5 10*3/uL — ABNORMAL HIGH (ref 4.0–10.5)

## 2016-08-20 LAB — URINE MICROSCOPIC-ADD ON

## 2016-08-20 LAB — POC URINE PREG, ED: PREG TEST UR: NEGATIVE

## 2016-08-20 LAB — LIPASE, BLOOD: LIPASE: 25 U/L (ref 11–51)

## 2016-08-20 MED ORDER — ONDANSETRON HCL 4 MG PO TABS
4.0000 mg | ORAL_TABLET | Freq: Four times a day (QID) | ORAL | 0 refills | Status: DC
Start: 2016-08-20 — End: 2020-05-07

## 2016-08-20 MED ORDER — POTASSIUM CHLORIDE CRYS ER 20 MEQ PO TBCR: 20.0000 meq | EXTENDED_RELEASE_TABLET | Freq: Two times a day (BID) | ORAL | 0 refills | Status: DC

## 2016-08-20 MED ORDER — KETOROLAC TROMETHAMINE 30 MG/ML IJ SOLN
30.0000 mg | Freq: Once | INTRAMUSCULAR | Status: AC
  Administered 2016-08-20: 30 mg via INTRAVENOUS
  Filled 2016-08-20: qty 1

## 2016-08-20 MED ORDER — TAMSULOSIN HCL 0.4 MG PO CAPS: 0.4000 mg | ORAL_CAPSULE | Freq: Every day | ORAL | 0 refills | Status: DC

## 2016-08-20 MED ORDER — HYDROCODONE-ACETAMINOPHEN 5-325 MG PO TABS: 1.0000 | ORAL_TABLET | ORAL | 0 refills | Status: DC | PRN

## 2016-08-20 MED ORDER — TAMSULOSIN HCL 0.4 MG PO CAPS
0.4000 mg | ORAL_CAPSULE | Freq: Once | ORAL | Status: AC
  Administered 2016-08-20: 0.4 mg via ORAL
  Filled 2016-08-20: qty 1

## 2016-08-20 MED ORDER — SODIUM CHLORIDE 0.9 % IV BOLUS (SEPSIS)
1000.0000 mL | Freq: Once | INTRAVENOUS | Status: AC
  Administered 2016-08-20: 1000 mL via INTRAVENOUS

## 2016-08-20 MED ORDER — ONDANSETRON HCL 4 MG/2ML IJ SOLN
4.0000 mg | Freq: Once | INTRAMUSCULAR | Status: AC
  Administered 2016-08-20: 4 mg via INTRAVENOUS
  Filled 2016-08-20: qty 2

## 2016-08-20 MED ORDER — MORPHINE SULFATE (PF) 4 MG/ML IV SOLN
4.0000 mg | Freq: Once | INTRAVENOUS | Status: AC
  Administered 2016-08-20: 4 mg via INTRAVENOUS
  Filled 2016-08-20: qty 1

## 2016-08-20 MED ORDER — POTASSIUM CHLORIDE CRYS ER 20 MEQ PO TBCR
40.0000 meq | EXTENDED_RELEASE_TABLET | Freq: Once | ORAL | Status: AC
  Administered 2016-08-20: 40 meq via ORAL
  Filled 2016-08-20: qty 2

## 2016-08-20 NOTE — ED Provider Notes (Signed)
[facility] DEPT Provider Note   CSN: [id number] Arrival date & time: 08/20/16  0549     History   Chief Complaint Chief Complaint  Patient presents with  . Flank Pain    HPI Brianna Cordova is a 25 y.o. female.  25 year old African-American female with no significant past medical history presents to the ED this morning with right flank pain. The patient says the pain started approximately 3 AM this morning. She was at work when the pain started. Patient states the pain is in her right flank and radiates to her right lower quadrant. She states that it feels like she has to urinate. Patient describes the pain as a constant and sharp. Moving makes the pain worse. Nothing makes the pain better. Patient has not tried anything for the pain at home. States that she has not had anything like this before. Patient also endorses nausea and emesis. States she's had 11 episodes of emesis since this morning when the pain started. Patient states that she has a history of kidney stones however this is feels different. Patient denies any urinary symptoms including urgency, frequency, hematuria, dysuria. No change in bowel habits including hematochezia or melena. Patient denies any fever, chills, lightheadedness, dizziness, headache, vision changes, chest pain, shortness of breath, numbness/tingling, denies any vaginal bleeding, vaginal discharge.       Past Medical History:  Diagnosis Date  . Depression   . Epilepsy (HCC)    last seizure age 89  . Migraine     Patient Active Problem List   Diagnosis Date Noted  . Unspecified episodic mood disorder 02/18/2013  . MDD (major depressive disorder), single episode, severe with psychotic features (HCC) 07/18/2012    Class: Acute  . Borderline behavior 07/18/2012    Class: Chronic    Past Surgical History:  Procedure Laterality Date  . WISDOM TOOTH EXTRACTION  Age 25    OB History    No data available       Home Medications     Prior to Admission medications   Medication Sig Start Date End Date Taking? Authorizing Provider  cetirizine (ZYRTEC) 10 MG tablet Take 1 tablet (10 mg total) by mouth daily. Patient not taking: Reported on 04/01/2015 02/10/15   Charm RingsErin J Honig, MD  citalopram (CELEXA) 20 MG tablet Take 1 and 1/2 tab daily 09/20/14   Cleotis NipperSyed T Arfeen, MD  cyclobenzaprine (FLEXERIL) 10 MG tablet Take 1 tablet (10 mg total) by mouth 3 (three) times daily as needed for muscle spasms. 06/28/16   Azalia BilisKevin Campos, MD  ibuprofen (ADVIL,MOTRIN) 600 MG tablet Take 1 tablet (600 mg total) by mouth every 8 (eight) hours as needed. 06/28/16   Azalia BilisKevin Campos, MD  ondansetron (ZOFRAN) 4 MG tablet Take 1 tablet (4 mg total) by mouth every 8 (eight) hours as needed for nausea or vomiting. 05/30/16   Linwood DibblesJon Knapp, MD  risperiDONE (RISPERDAL) 1 MG tablet Take 1/2 to 1 tab at bed time 09/20/14   Cleotis NipperSyed T Arfeen, MD    Family History Family History  Problem Relation Age of Onset  . Diabetes Mother   . Hypertension Father     Social History Social History  Substance Use Topics  . Smoking status: Never Smoker  . Smokeless tobacco: Never Used  . Alcohol use No     Allergies   Black pepper [piper]   Review of Systems Review of Systems  Constitutional: Negative for chills, diaphoresis and fever.  HENT: Negative for congestion, ear pain, rhinorrhea and  sore throat.   Eyes: Negative for pain and visual disturbance.  Respiratory: Negative for cough and shortness of breath.   Cardiovascular: Negative for chest pain, palpitations and leg swelling.  Gastrointestinal: Positive for abdominal pain, nausea and vomiting. Negative for blood in stool, constipation and diarrhea.  Genitourinary: Positive for flank pain (Right). Negative for dysuria, frequency, hematuria, urgency, vaginal bleeding, vaginal discharge and vaginal pain.  Musculoskeletal: Negative for arthralgias and back pain.  Skin: Negative for color change and rash.  Neurological:  Negative for dizziness, syncope, weakness, light-headedness and numbness.  All other systems reviewed and are negative.    Physical Exam Updated Vital Signs BP 130/81 (BP Location: Left Arm)   Pulse 81   Temp 99 F (37.2 C) (Oral)   Resp 18   Ht 5\' 1"  (1.549 m)   Wt 90.7 kg   LMP  (LMP Unknown) Comment: negative urine pregnancy test 08/20/16  SpO2 100%   BMI 37.79 kg/m   Physical Exam  Constitutional: She appears well-developed and well-nourished. No distress.  Patient is screaming, crying, and thrashing around the in the bed on my exam.  HENT:  Head: Normocephalic and atraumatic.  Mouth/Throat: Oropharynx is clear and moist.  Eyes: Conjunctivae are normal. Pupils are equal, round, and reactive to light. Right eye exhibits no discharge. Left eye exhibits no discharge. No scleral icterus.  Neck: Normal range of motion. Neck supple. No thyromegaly present.  Cardiovascular: Normal rate, regular rhythm, normal heart sounds and intact distal pulses.  Exam reveals no gallop and no friction rub.   No murmur heard. Pulmonary/Chest: Effort normal and breath sounds normal.  Abdominal: Soft. Bowel sounds are normal. She exhibits no distension. There is tenderness in the right upper quadrant and right lower quadrant. There is no rigidity, no rebound, no guarding, no CVA tenderness, no tenderness at McBurney's point and negative Murphy's sign.  Musculoskeletal: Normal range of motion. She exhibits no edema.  Lymphadenopathy:    She has no cervical adenopathy.  Neurological: She is alert.  Skin: Skin is warm and dry. Capillary refill takes less than 2 seconds.  Vitals reviewed.    ED Treatments / Results  Labs (all labs ordered are listed, but only abnormal results are displayed) Labs Reviewed  URINALYSIS, ROUTINE W REFLEX MICROSCOPIC (NOT AT [facility]) - Abnormal; Notable for the following:       Result Value   APPearance CLOUDY (*)    Ketones, ur 15 (*)    Leukocytes, UA SMALL (*)     All other components within normal limits  COMPREHENSIVE METABOLIC PANEL - Abnormal; Notable for the following:    Potassium 3.1 (*)    Glucose, Bld 111 (*)    Creatinine, Ser 1.14 (*)    Total Protein 8.2 (*)    All other components within normal limits  CBC WITH DIFFERENTIAL/PLATELET - Abnormal; Notable for the following:    WBC 12.5 (*)    Hemoglobin 11.8 (*)    HCT 34.3 (*)    MCV 75.4 (*)    MCH 25.9 (*)    Neutro Abs 10.4 (*)    All other components within normal limits  URINE MICROSCOPIC-ADD ON - Abnormal; Notable for the following:    Squamous Epithelial / LPF 6-30 (*)    Bacteria, UA FEW (*)    Crystals CA OXALATE CRYSTALS (*)    All other components within normal limits  URINE CULTURE  LIPASE, BLOOD  POC URINE PREG, ED    EKG  EKG Interpretation  None       Radiology Ct Renal Stone Study  Result Date: 08/20/2016 CLINICAL DATA:  Right flank pain EXAM: CT ABDOMEN AND PELVIS WITHOUT CONTRAST TECHNIQUE: Multidetector CT imaging of the abdomen and pelvis was performed following the standard protocol without IV contrast. COMPARISON:  07/04/2014 FINDINGS: Lower chest:  No contributory findings. Hepatobiliary: No focal liver abnormality.No evidence of biliary obstruction or stone. Pancreas: Unremarkable. Spleen: Unremarkable. Adrenals/Urinary Tract: Negative adrenals. 3 mm stone distal right ureter with right hydroureteronephrosis and asymmetric right perinephric expansion and low-density. No left hydronephrosis or urolithiasis. Unremarkable bladder. Stomach/Bowel:  No obstruction. No appendicitis. Vascular/Lymphatic: No acute vascular abnormality. No mass or adenopathy. Reproductive:No pathologic findings. Other: No ascites or pneumoperitoneum. Musculoskeletal: No acute abnormalities. Levocurvature of the spine, not seen previously, potentially positional in this setting. IMPRESSION: Obstructing 3 mm distal right ureteral stone. Electronically Signed   By: Marnee Spring M.D.    On: 08/20/2016 07:08    Procedures Procedures (including critical care time)  Medications Ordered in ED Medications  ondansetron (ZOFRAN) injection 4 mg (4 mg Intravenous Given 08/20/16 0630)  morphine 4 MG/ML injection 4 mg (4 mg Intravenous Given 08/20/16 0630)  sodium chloride 0.9 % bolus 1,000 mL (0 mLs Intravenous Stopped 08/20/16 0809)  morphine 4 MG/ML injection 4 mg (4 mg Intravenous Given 08/20/16 0754)  ketorolac (TORADOL) 30 MG/ML injection 30 mg (30 mg Intravenous Given 08/20/16 0809)  tamsulosin (FLOMAX) capsule 0.4 mg (0.4 mg Oral Given 08/20/16 0809)  potassium chloride SA (K-DUR,KLOR-CON) CR tablet 40 mEq (40 mEq Oral Given 08/20/16 0809)     Initial Impression / Assessment and Plan / ED Course  I have reviewed the triage vital signs and the nursing notes.  Pertinent labs & imaging results that were available during my care of the patient were reviewed by me and considered in my medical decision making (see chart for details).  Clinical Course  Pt has been diagnosed with a 3mm Kidney Stone via CT. Mild hydronephrosis noted, serum creatine WNL, vitals sign stable and the pt does not have irratractable vomiting.Patient without obvious signs of UTI. Urine culture sent. Pain was controlled in ED. Patient potassium was also noted to be low at 3.1. Given dose of oral potassium in ED. Prescription given for potassium replacement at home times one week. Mild leukocytosis noted. However patient is afebrile and not tachycardic. Patient encouraged to follow up with primary care doctor for repeat potassium. Pt will be dc home with pain medications, Zofran, flomax & has been advised to follow up with urology. Given urine strainer. Patient was discussed with Dr. Juleen China who agrees with the above plan. Patient verbalized understanding with plan of care. Strict return precautions given including if patient develops fevers or worsening pain. Hemodynamically stable and discharged home in no acute  distress.  Final Clinical Impressions(s) / ED Diagnoses   Final diagnoses:  Nephrolithiasis  Ureteral colic    New Prescriptions Discharge Medication List as of 08/20/2016  8:08 AM    START taking these medications   Details  HYDROcodone-acetaminophen (NORCO/VICODIN) 5-325 MG tablet Take 1 tablet by mouth every 4 (four) hours as needed., Starting Mon 08/20/2016, Print    potassium chloride SA (K-DUR,KLOR-CON) 20 MEQ tablet Take 1 tablet (20 mEq total) by mouth 2 (two) times daily., Starting Mon 08/20/2016, Print    tamsulosin (FLOMAX) 0.4 MG CAPS capsule Take 1 capsule (0.4 mg total) by mouth daily., Starting Mon 08/20/2016, Print         Iantha Fallen T  Cruzita Lederer, PA-C 08/20/16 1614    Raeford Razor, MD 08/21/16 [phone]

## 2016-08-20 NOTE — ED Triage Notes (Signed)
Patient screaming and crying, c/o rt flank pain since around 0300. States she has been nauseated and vomited about eleven times. Patient describes pain as stabbing.

## 2016-08-20 NOTE — Discharge Instructions (Signed)
Right-sided kidney stone seen on CT. Please take the Zofran as needed for nausea. Take the Flomax once a day. May take the Norco as needed for pain. Also continue to take ibuprofen for pain. I have prescribed you a  weeks worth of potassium supplements. Please take them as prescribed. Continue to follow-up with her primary care doctor to have your potassium rechecked. I have also given the referral to urology. Use the strainer for your urine. Please return to the ER if your symptoms worsen or if you develop fever or worsening abdominal pain.

## 2016-08-21 LAB — URINE CULTURE

## 2017-12-09 IMAGING — CT CT RENAL STONE PROTOCOL
2 of 3 series · 17 of 41 positions shown, 19 images · non-contrast
Comparison: 07/04/2014

CLINICAL DATA: Right flank pain

EXAM:
CT ABDOMEN AND PELVIS WITHOUT CONTRAST
TECHNIQUE: Multidetector CT imaging of the abdomen and pelvis was performed
following the standard protocol without IV contrast.

[Series 3: lung · axial · 0.64mm/px · z∈[+1472,+1520]mm · 14 of 27 slices shown, 16 images]
[im 2/27  soft-tissue]
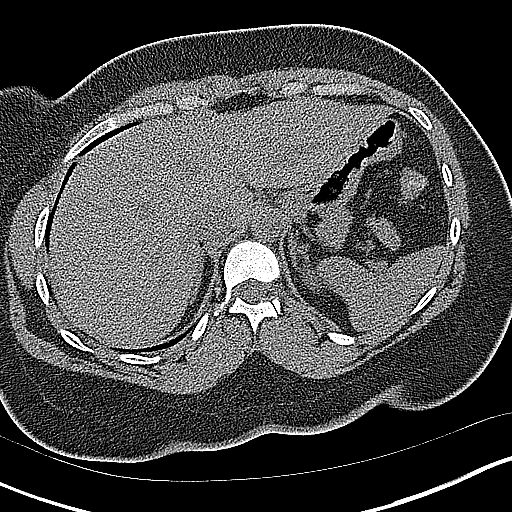
[im 2/27  bone]
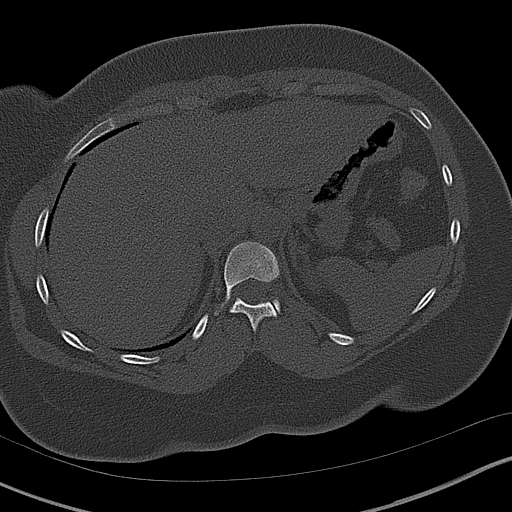
[im 4/27  soft-tissue]
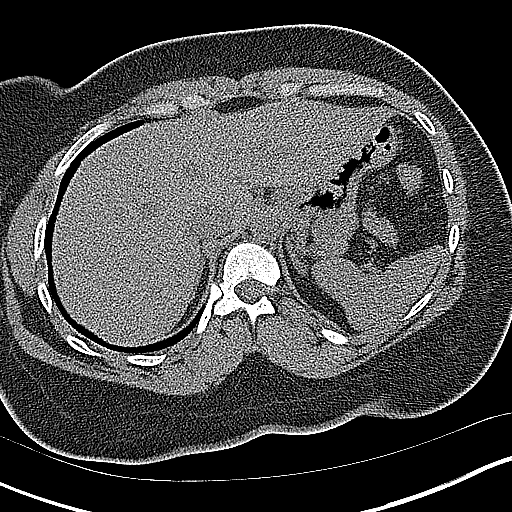
[im 6/27  soft-tissue]
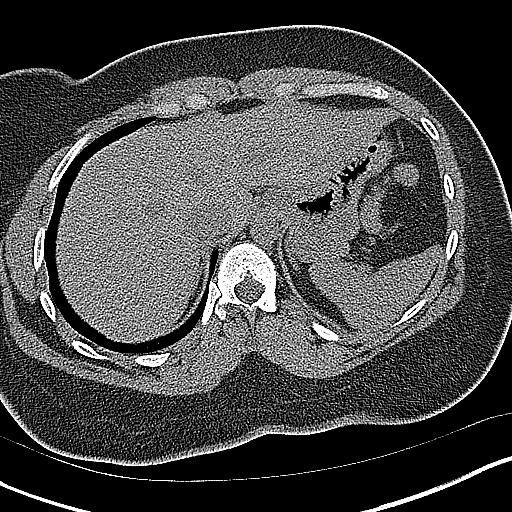
[im 8/27  soft-tissue]
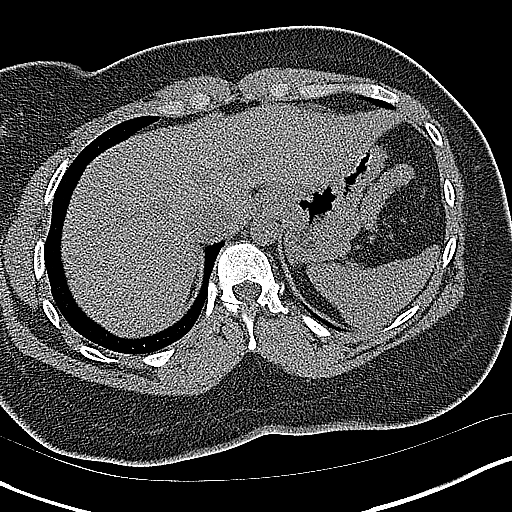
[im 10/27  soft-tissue]
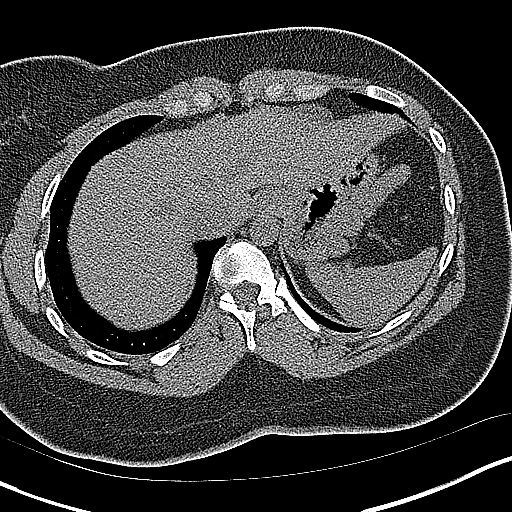
[im 11/27  soft-tissue]
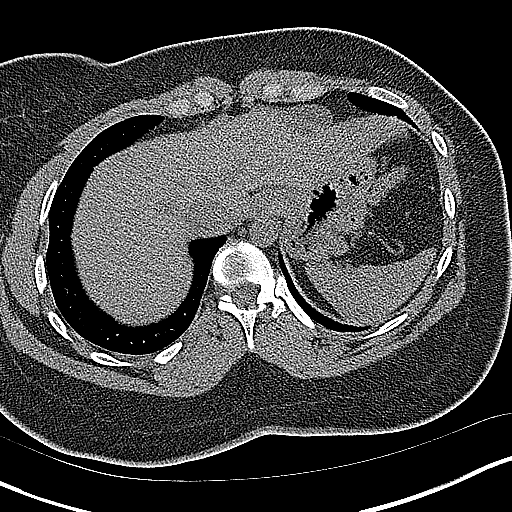
[im 13/27  soft-tissue]
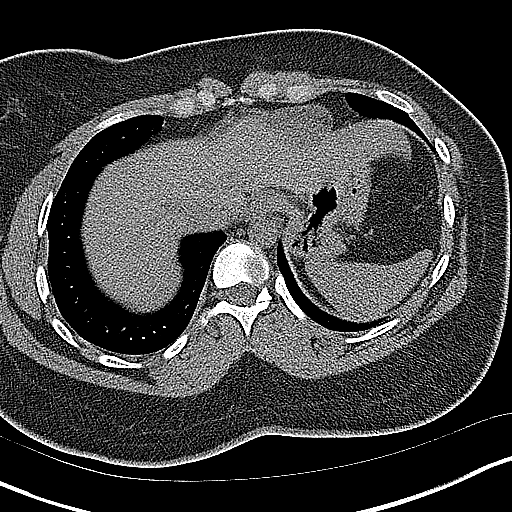
[im 15/27  soft-tissue]
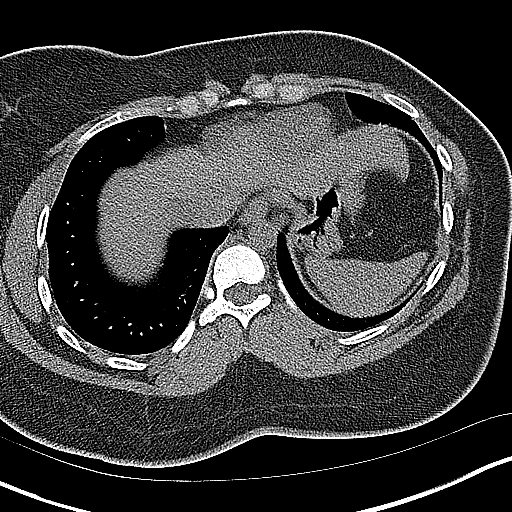
[im 17/27  soft-tissue]
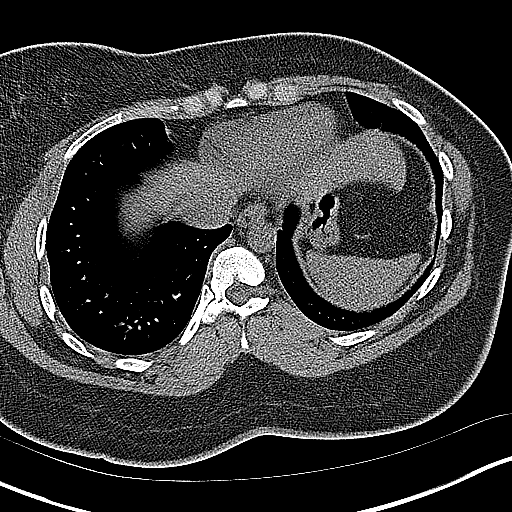
[im 17/27  bone]
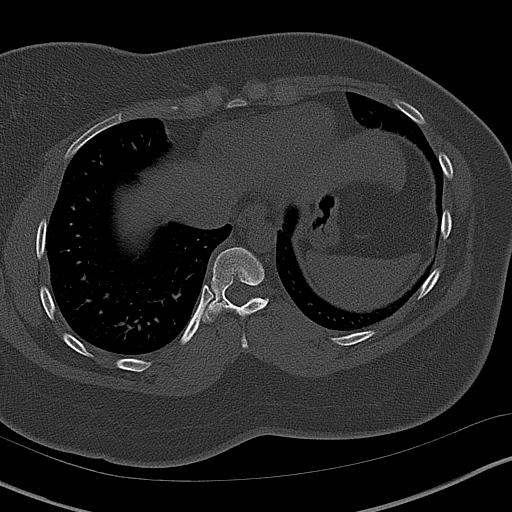
[im 18/27  soft-tissue]
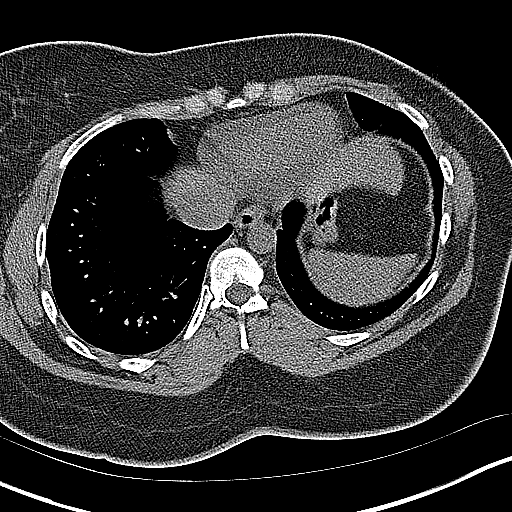
[im 20/27  soft-tissue]
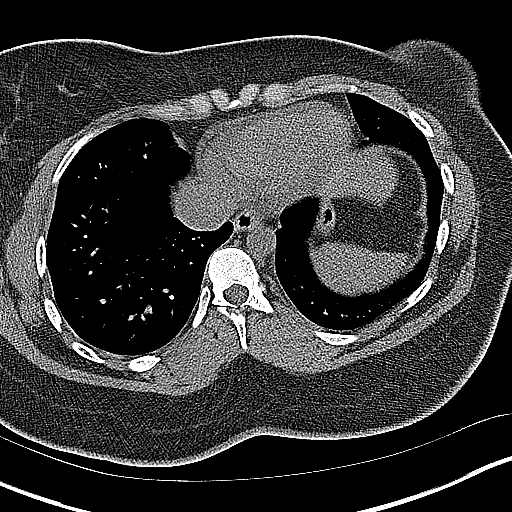
[im 22/27  soft-tissue]
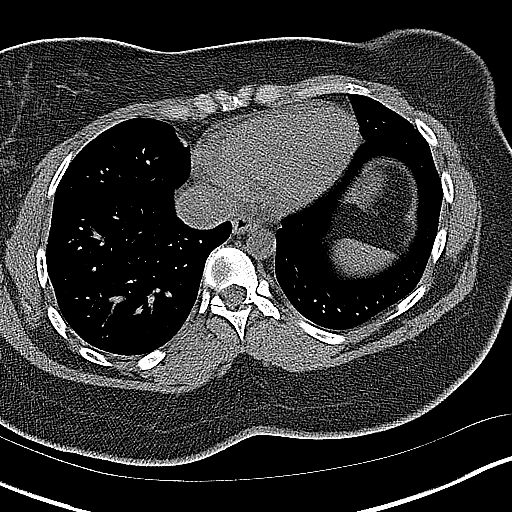
[im 24/27  soft-tissue]
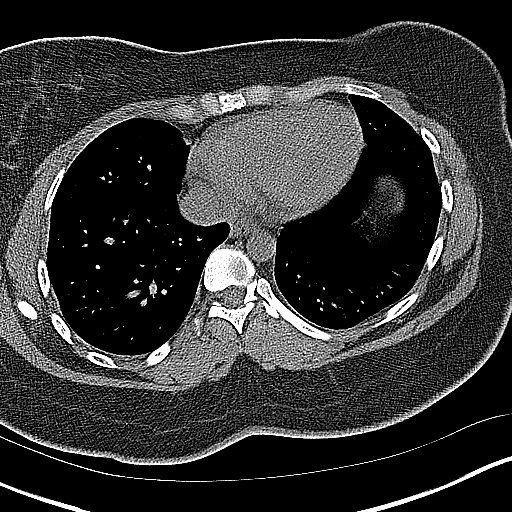
[im 26/27  soft-tissue]
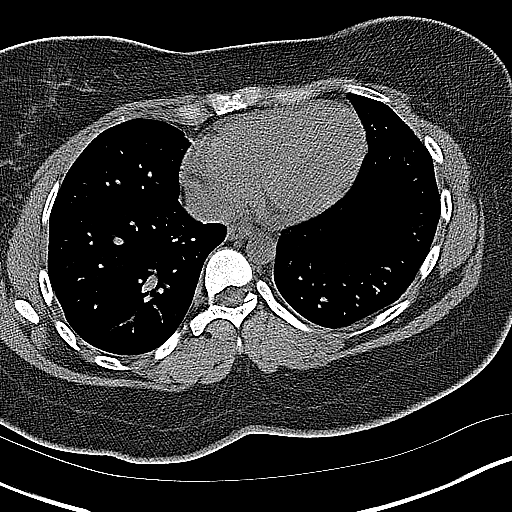

[Series 4: coronal · coronal · 0.62mm/px · 3 of 122 slices shown]
[im 41/122  soft-tissue]
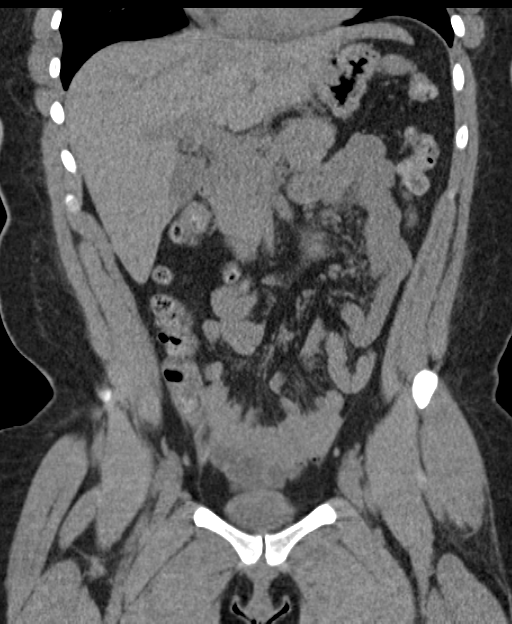
[im 54/122  soft-tissue]
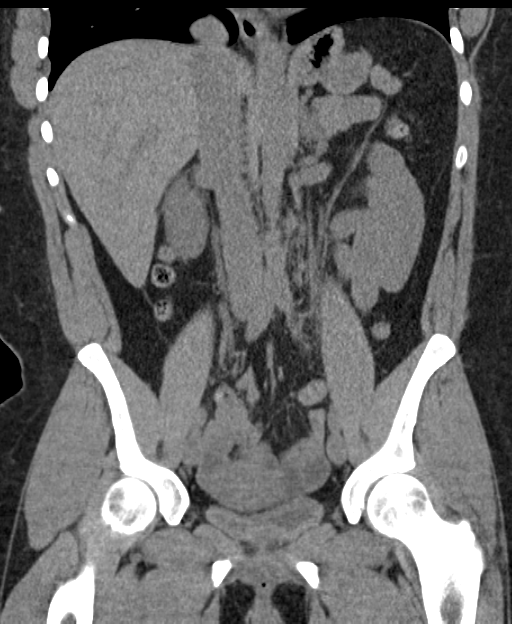
[im 68/122  soft-tissue]
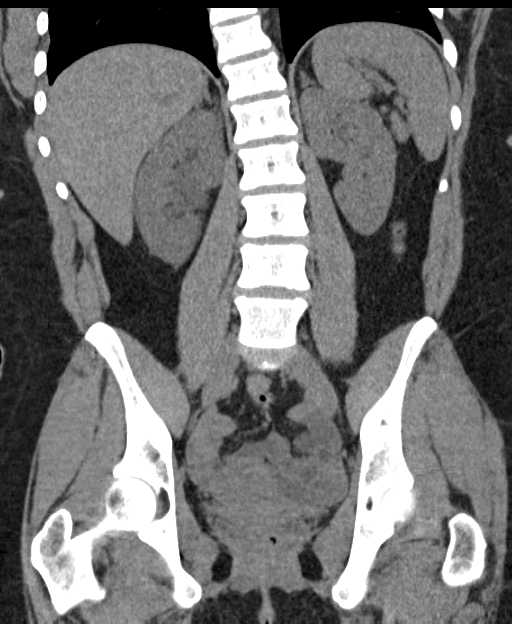

[17 of 41 positions shown; findings below may reference images not displayed]

FINDINGS: Lower chest:  No contributory findings.

Hepatobiliary: No focal liver abnormality.No evidence of biliary
obstruction or stone.

Pancreas: Unremarkable.

Spleen: Unremarkable.

Adrenals/Urinary Tract: Negative adrenals. 3 mm stone distal right
ureter with right hydroureteronephrosis and asymmetric right
perinephric expansion and low-density. No left hydronephrosis or
urolithiasis. Unremarkable bladder.

Stomach/Bowel:  No obstruction. No appendicitis.

Vascular/Lymphatic: No acute vascular abnormality. No mass or
adenopathy.

Reproductive:No pathologic findings.

Other: No ascites or pneumoperitoneum.

Musculoskeletal: No acute abnormalities. Levocurvature of the spine,
not seen previously, potentially positional in this setting.
IMPRESSION: Obstructing 3 mm distal right ureteral stone.

## 2020-05-03 ENCOUNTER — Ambulatory Visit (HOSPITAL_COMMUNITY)
Admission: AD | Admit: 2020-05-03 | Discharge: 2020-05-03 | Disposition: A | Discharge status: Other Acute Inpatient Hospital | Payer: No Payment, Other | Attending: Psychiatry | Admitting: Psychiatry

## 2020-05-03 ENCOUNTER — Inpatient Hospital Stay (HOSPITAL_COMMUNITY)
Admission: EM | Admit: 2020-05-03 | Discharge: 2020-05-07 | DRG: 885 | Disposition: A | Discharge status: Home / Self Care | Payer: Federal, State, Local not specified - Other | Source: Intra-hospital | Attending: Psychiatry | Admitting: Psychiatry

## 2020-05-03 ENCOUNTER — Encounter (HOSPITAL_COMMUNITY): Payer: Self-pay | Admitting: Psychiatry

## 2020-05-03 ENCOUNTER — Other Ambulatory Visit: Payer: Self-pay

## 2020-05-03 DIAGNOSIS — R45851 Suicidal ideations: Secondary | ICD-10-CM | POA: Diagnosis present

## 2020-05-03 DIAGNOSIS — F333 Major depressive disorder, recurrent, severe with psychotic symptoms: Secondary | ICD-10-CM | POA: Diagnosis present

## 2020-05-03 DIAGNOSIS — F323 Major depressive disorder, single episode, severe with psychotic features: Secondary | ICD-10-CM

## 2020-05-03 DIAGNOSIS — G4700 Insomnia, unspecified: Secondary | ICD-10-CM | POA: Diagnosis present

## 2020-05-03 DIAGNOSIS — Z20822 Contact with and (suspected) exposure to covid-19: Secondary | ICD-10-CM | POA: Diagnosis not present

## 2020-05-03 DIAGNOSIS — F419 Anxiety disorder, unspecified: Secondary | ICD-10-CM | POA: Diagnosis present

## 2020-05-03 DIAGNOSIS — F329 Major depressive disorder, single episode, unspecified: Secondary | ICD-10-CM | POA: Insufficient documentation

## 2020-05-03 DIAGNOSIS — F319 Bipolar disorder, unspecified: Secondary | ICD-10-CM | POA: Diagnosis present

## 2020-05-03 DIAGNOSIS — R440 Auditory hallucinations: Secondary | ICD-10-CM | POA: Insufficient documentation

## 2020-05-03 HISTORY — DX: Anxiety disorder, unspecified: F41.9

## 2020-05-03 LAB — COMPREHENSIVE METABOLIC PANEL
ALT: 13 U/L (ref 0–44)
AST: 15 U/L (ref 15–41)
Albumin: 4.3 g/dL (ref 3.5–5.0)
Alkaline Phosphatase: 44 U/L (ref 38–126)
Anion gap: 7 (ref 5–15)
BUN: 10 mg/dL (ref 6–20)
CO2: 25 mmol/L (ref 22–32)
Calcium: 9.2 mg/dL (ref 8.9–10.3)
Chloride: 102 mmol/L (ref 98–111)
Creatinine, Ser: 0.86 mg/dL (ref 0.44–1.00)
GFR calc Af Amer: >60 mL/min (ref >60)
GFR calc non Af Amer: >60 mL/min (ref >60)
Glucose, Bld: 85 mg/dL (ref 70–99)
Potassium: 4 mmol/L (ref 3.5–5.1)
Sodium: 134 mmol/L — ABNORMAL LOW (ref 135–145)
Total Bilirubin: 1.3 mg/dL — ABNORMAL HIGH (ref 0.3–1.2)
Total Protein: 7.3 g/dL (ref 6.5–8.1)

## 2020-05-03 LAB — TSH: TSH: 1.254 u[IU]/mL (ref 0.350–4.500)

## 2020-05-03 LAB — LIPID PANEL
Cholesterol: 213 mg/dL — ABNORMAL HIGH (ref 0–200)
HDL: 91 mg/dL (ref >40)
LDL Cholesterol: 116 mg/dL — ABNORMAL HIGH (ref 0–99)
Total CHOL/HDL Ratio: 2.3 RATIO
Triglycerides: 29 mg/dL (ref <150)
VLDL: 6 mg/dL (ref 0–40)

## 2020-05-03 LAB — POCT URINE DRUG SCREEN - MANUAL ENTRY (I-SCREEN)
POC Amphetamine UR: NOT DETECTED
POC Buprenorphine (BUP): NOT DETECTED
POC Cocaine UR: NOT DETECTED
POC Marijuana UR: NOT DETECTED
POC Methadone UR: NOT DETECTED
POC Methamphetamine UR: NOT DETECTED
POC Morphine: NOT DETECTED
POC Oxazepam (BZO): NOT DETECTED
POC Oxycodone UR: NOT DETECTED
POC Secobarbital (BAR): NOT DETECTED

## 2020-05-03 LAB — HEMOGLOBIN A1C
Hgb A1c MFr Bld: 5.1 % (ref 4.8–5.6)
Mean Plasma Glucose: 99.67 mg/dL

## 2020-05-03 LAB — POC SARS CORONAVIRUS 2 AG: SARS Coronavirus 2 Ag: NEGATIVE

## 2020-05-03 LAB — POCT PREGNANCY, URINE: Preg Test, Ur: NEGATIVE

## 2020-05-03 LAB — CBC
HCT: 38.6 % (ref 36.0–46.0)
Hemoglobin: 12.6 g/dL (ref 12.0–15.0)
MCH: 26.5 pg (ref 26.0–34.0)
MCHC: 32.6 g/dL (ref 30.0–36.0)
MCV: 81.3 fL (ref 80.0–100.0)
Platelets: 219 10*3/uL (ref 150–400)
RBC: 4.75 MIL/uL (ref 3.87–5.11)
RDW: 11.9 % (ref 11.5–15.5)
WBC: 7.4 10*3/uL (ref 4.0–10.5)
nRBC: 0 % (ref 0.0–0.2)

## 2020-05-03 LAB — POC SARS CORONAVIRUS 2 AG -  ED: SARS Coronavirus 2 Ag: NEGATIVE

## 2020-05-03 MED ORDER — HYDROXYZINE HCL 25 MG PO TABS: 25.0000 mg | ORAL_TABLET | Freq: Three times a day (TID) | ORAL | Status: DC | PRN

## 2020-05-03 MED ORDER — MAGNESIUM HYDROXIDE 400 MG/5ML PO SUSP: 30.0000 mL | Freq: Every day | ORAL | Status: DC | PRN

## 2020-05-03 MED ORDER — ACETAMINOPHEN 325 MG PO TABS: 650.0000 mg | ORAL_TABLET | Freq: Four times a day (QID) | ORAL | Status: DC | PRN

## 2020-05-03 MED ORDER — ALUM & MAG HYDROXIDE-SIMETH 200-200-20 MG/5ML PO SUSP: 30.0000 mL | ORAL | Status: DC | PRN

## 2020-05-03 MED ORDER — TRAZODONE HCL 50 MG PO TABS: 50.0000 mg | ORAL_TABLET | Freq: Every evening | ORAL | Status: DC | PRN

## 2020-05-03 MED ORDER — HYDROXYZINE HCL 25 MG PO TABS
25.0000 mg | ORAL_TABLET | Freq: Once | ORAL | Status: AC
  Administered 2020-05-03: 25 mg via ORAL
  Filled 2020-05-03 (×2): qty 1

## 2020-05-03 NOTE — ED Notes (Signed)
Spoke with Jerolyn Shin in registration patient to be transported and bed will available at 5 pm. [facility] called to cancel current transport and inform patient to transport at 5 pm.

## 2020-05-03 NOTE — ED Notes (Signed)
Patient resting with eyes closed. No distress noted. Respirations even and non labored. Monitoring continues.
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
## 2020-05-03 NOTE — ED Notes (Signed)
Patient resting with eyes closed. Respirations even and non labored. No distress noted. Monitoring continues. 

## 2020-05-03 NOTE — ED Notes (Signed)
Report called to Elizabeth RN at [facility]. 

## 2020-05-03 NOTE — ED Provider Notes (Signed)
Behavioral Health Medical Screening Exam  Brianna Cordova is a 29 y.o. female who presents with complaints of depression and suicidal thoughts. The patient reported she's been going through a lot of depressive episodes and having a lot of suicidal thoughts. The patient stated, "I have voices in my head telling me to kill myself and sometimes I want to listen. I want to get an object and kill myself. It's hard not to do." She reported feeling stressed a lot and lacking motivation to do things. The patient stated, everyday I want to die, but I find a reason to live. I don't know why. There's no reason for me to live." The patient was unable to identify any stressor, but reported that everything in her life goes wrong, and never goes right, so why should she keep trying if she keeps failing. She reported a previous hospitalization at [facility] in her early 20's and at that time was started on medications for Bipolar disorder. She unable to recall the name of the medications. She reported that she stopped taking the medications when she was around 29 years old because her husband at that time told her that she didn't need to take the medications. Per the patient, she is currently going through a divorce. She reported that she's been struggling for years since she stopped taking her medications.   Total Time spent with patient: 30 minutes   Assessment: The patient was examined and assessed face to face. Discussed with the patient being admitted on an inpatient unit at [facility] for medication management, observation and safety. The patient agreed to sign voluntarily for inpatient treatment.  Covid test ordered per inpatient admission requirement.   Psychiatric Specialty Exam   Presentation  General Appearance:Disheveled  Eye Contact:Minimal  Speech:Clear and Coherent  Speech Volume:Decreased  Handedness:Right   Mood and Affect   Mood:Depressed;Dysphoric;Hopeless;Irritable  Affect:Congruent;Tearful   Thought Process  Thought Processes:Coherent  Descriptions of Associations:Intact  Orientation:Full (Time, Place and Person)  Thought Content:WDL  Hallucinations:Auditory "The voices are telling me to kill myself."  Ideas of Reference:None  Suicidal Thoughts:Yes, Active With Plan;Without Intent  Homicidal Thoughts:No   Sensorium  Memory:Immediate Fair;Recent Fair;Remote Good  Judgment:Poor  Insight:Fair   Executive Functions  Concentration:No data recorded Attention Span:No data recorded [place]   Psychomotor Activity  Psychomotor Activity:Normal   Assets  Assets:Communication Skills;Desire for Improvement;Financial Resources/Insurance;Housing;Leisure Time;Physical Health;Transportation   Sleep  Sleep:Fair  Number of hours: No data recorded  Physical Exam: Physical Exam Vitals and nursing note reviewed.  Constitutional:      Appearance: She is well-developed.  Cardiovascular:     Rate and Rhythm: Normal rate.  Pulmonary:     Effort: Pulmonary effort is normal.  Musculoskeletal:        General: Normal range of motion.  Skin:    General: Skin is warm.  Neurological:     Mental Status: She is alert and oriented to person, place, and time.    Review of Systems  Constitutional: Negative.   HENT: Negative.   Eyes: Negative.   Respiratory: Negative.   Cardiovascular: Negative.   Gastrointestinal: Negative.   Genitourinary: Negative.   Musculoskeletal: Negative.   Skin: Negative.   Neurological: Negative.   Endo/Heme/Allergies: Negative.   Psychiatric/Behavioral: Positive for depression, hallucinations and suicidal ideas.   Blood pressure (!) 142/98, pulse 83, resp. rate 16, height 5\' 1"  (1.549 m), weight 153 lb (69.4 kg), SpO2 100 %. Body mass index  is 28.91 kg/m.  Musculoskeletal: Strength & Muscle Tone: within normal  limits Gait & Station: normal Patient leans: Right   Recommendations:  Based on my evaluation the patient does not appear to have an emergency medical condition. The patient will be admitted to [facility], [address] [facility] for inpatient treatment.     Gerlene Burdock Deonta Bomberger, FNP 05/03/2020, 12:23 PM

## 2020-05-03 NOTE — Discharge Instructions (Signed)
None

## 2020-05-03 NOTE — ED Notes (Signed)
Patient alert, oriented and ambulatory. Patient aware of admission to [facility].  Patient left via safe transport.

## 2020-05-04 DIAGNOSIS — F323 Major depressive disorder, single episode, severe with psychotic features: Secondary | ICD-10-CM | POA: Diagnosis present

## 2020-05-04 MED ORDER — RISPERIDONE 0.5 MG PO TABS
0.5000 mg | ORAL_TABLET | ORAL | Status: AC
  Administered 2020-05-04: 0.5 mg via ORAL
  Filled 2020-05-04 (×2): qty 1

## 2020-05-04 MED ORDER — LORAZEPAM 1 MG PO TABS
ORAL_TABLET | ORAL | Status: AC
  Filled 2020-05-04: qty 1

## 2020-05-04 MED ORDER — CITALOPRAM HYDROBROMIDE 10 MG PO TABS
10.0000 mg | ORAL_TABLET | Freq: Every day | ORAL | Status: DC
  Administered 2020-05-04 – 2020-05-07 (×4): 10 mg via ORAL
  Filled 2020-05-04 (×2): qty 7
  Filled 2020-05-04 (×6): qty 1

## 2020-05-04 MED ORDER — HYDROXYZINE HCL 25 MG PO TABS
25.0000 mg | ORAL_TABLET | Freq: Three times a day (TID) | ORAL | Status: DC | PRN
  Administered 2020-05-04: 25 mg via ORAL
  Filled 2020-05-04 (×3): qty 1

## 2020-05-04 MED ORDER — ACETAMINOPHEN 325 MG PO TABS: 650.0000 mg | ORAL_TABLET | Freq: Four times a day (QID) | ORAL | Status: DC | PRN

## 2020-05-04 MED ORDER — RISPERIDONE 1 MG PO TABS
1.0000 mg | ORAL_TABLET | Freq: Every day | ORAL | Status: DC
  Administered 2020-05-04 – 2020-05-06 (×3): 1 mg via ORAL
  Filled 2020-05-04: qty 7
  Filled 2020-05-04: qty 1
  Filled 2020-05-04: qty 7
  Filled 2020-05-04 (×3): qty 1

## 2020-05-04 MED ORDER — TRAZODONE HCL 50 MG PO TABS
50.0000 mg | ORAL_TABLET | Freq: Every evening | ORAL | Status: DC | PRN
  Administered 2020-05-04 – 2020-05-06 (×3): 50 mg via ORAL
  Filled 2020-05-04 (×4): qty 1
  Filled 2020-05-04: qty 7

## 2020-05-04 MED ORDER — ALUM & MAG HYDROXIDE-SIMETH 200-200-20 MG/5ML PO SUSP: 30.0000 mL | ORAL | Status: DC | PRN

## 2020-05-04 MED ORDER — MAGNESIUM HYDROXIDE 400 MG/5ML PO SUSP: 30.0000 mL | Freq: Every day | ORAL | Status: DC | PRN

## 2020-05-04 MED ORDER — LORAZEPAM 1 MG PO TABS
1.0000 mg | ORAL_TABLET | ORAL | Status: AC
  Administered 2020-05-04: 1 mg via ORAL

## 2020-05-04 NOTE — Plan of Care (Signed)
Nurse discussed anxiety, depression and coping skills with patient.  

## 2020-05-04 NOTE — Progress Notes (Signed)
Patient stated this morning that she was suicidal, contracted for safety.  That she was hearing voices to hurt herself, no one cares.  Medication was given to patient who was sitting in floor near nurse's station, crying, holding her head.  1030  Patient was sitting in dayroom working puzzle.  Patient stated she is NOT suicidal now, that she is NOT hearing any voices.  Drink/food offered to patient.    D:  Patient's self inventory sheet, patient has fair sleep, no sleep medication.  Poor appetite, low energy level, poor concentration.  Rated depression, hopeless and anxiety #10.  Denied withdrawals.  SI, contracts for safety.  Denied physical problems.  Denied physical pain.  Goal is no more voices.  No discharge plans. A:  Medications administered per MD orders.  Emotional support and encouragement given patient. R:  Denied SI and HI later in the morning, contracts for safety.  Denied A/V hallucinations.  Denied pain.  Safety maintained with 15 minute checks.

## 2020-05-04 NOTE — Progress Notes (Signed)
   05/03/20 2045  Psych Admission Type (Psych Patients Only)  Admission Status Voluntary  Psychosocial Assessment  Patient Complaints Anxiety;Anhedonia;Appetite decrease;Crying spells;Depression;Hopelessness;Sadness;Worrying;Insomnia  Eye Contact Brief  Facial Expression Anxious;Flat;Sad;Worried  Affect Anxious;Depressed;Sad;Flat  Speech Soft;Slow;Logical/coherent  Interaction Assertive;Cautious  Motor Activity Slow  Appearance/Hygiene Unremarkable  Behavior Characteristics Cooperative;Anxious;Calm  Mood Depressed;Anxious;Sad;Helpless  Aggressive Behavior  Targets Other (Comment);Self (objects like "punch wall or throw stuff" & self "bang head")  Type of Behavior Striking out  Effect Self-harm  Thought Process  Coherency WDL  Content WDL  Delusions None reported or observed  Perception Hallucinations  Hallucination Auditory;Command  Judgment Poor  Confusion None  Danger to Self  Current suicidal ideation? Passive  Self-Injurious Behavior No self-injurious ideation or behavior indicators observed or expressed   Agreement Not to Harm Self Yes  Description of Agreement verbally contracts for safety  Danger to Others  Danger to Others None reported or observed

## 2020-05-04 NOTE — Tx Team (Signed)
Interdisciplinary Treatment and Diagnostic Plan Update  05/04/2020 Time of Session:  Brianna Cordova MRN: [id number]  Principal Diagnosis: <principal problem not specified>  Secondary Diagnoses: Active Problems:   MDD (major depressive disorder), recurrent, severe, with psychosis ([place])   Major depression with psychotic features ([place])   Current Medications:  Current Facility-Administered Medications  Medication Dose Route Frequency Provider Last Rate Last Admin  . acetaminophen (TYLENOL) tablet 650 mg  650 mg Oral Q6H PRN Sharma Covert, MD      . alum & mag hydroxide-simeth (MAALOX/MYLANTA) 200-200-20 MG/5ML suspension 30 mL  30 mL Oral Q4H PRN Sharma Covert, MD      . citalopram (CELEXA) tablet 10 mg  10 mg Oral Daily Sharma Covert, MD   10 mg at 05/04/20 4580  . hydrOXYzine (ATARAX/VISTARIL) tablet 25 mg  25 mg Oral TID PRN Sharma Covert, MD      . magnesium hydroxide (MILK OF MAGNESIA) suspension 30 mL  30 mL Oral Daily PRN Sharma Covert, MD      . risperiDONE (RISPERDAL) tablet 1 mg  1 mg Oral QHS Sharma Covert, MD      . traZODone (DESYREL) tablet 50 mg  50 mg Oral QHS PRN Sharma Covert, MD       PTA Medications: Medications Prior to Admission  Medication Sig Dispense Refill Last Dose  . cetirizine (ZYRTEC) 10 MG tablet Take 1 tablet (10 mg total) by mouth daily. (Patient not taking: Reported on 04/01/2015) 30 tablet 0   . citalopram (CELEXA) 20 MG tablet Take 1 and 1/2 tab daily 45 tablet 2   . cyclobenzaprine (FLEXERIL) 10 MG tablet Take 1 tablet (10 mg total) by mouth 3 (three) times daily as needed for muscle spasms. (Patient not taking: Reported on 08/20/2016) 12 tablet 0   . HYDROcodone-acetaminophen (NORCO/VICODIN) 5-325 MG tablet Take 1 tablet by mouth every 4 (four) hours as needed. 6 tablet 0   . ibuprofen (ADVIL,MOTRIN) 600 MG tablet Take 1 tablet (600 mg total) by mouth every 8 (eight) hours as needed. (Patient not taking: Reported on  08/20/2016) 15 tablet 0   . ondansetron (ZOFRAN) 4 MG tablet Take 1 tablet (4 mg total) by mouth every 6 (six) hours. 12 tablet 0   . potassium chloride SA (K-DUR,KLOR-CON) 20 MEQ tablet Take 1 tablet (20 mEq total) by mouth 2 (two) times daily. 14 tablet 0   . risperiDONE (RISPERDAL) 1 MG tablet Take 1/2 to 1 tab at bed time 30 tablet 2   . tamsulosin (FLOMAX) 0.4 MG CAPS capsule Take 1 capsule (0.4 mg total) by mouth daily. 7 capsule 0     Patient Stressors: Financial difficulties Marital or family conflict Medication change or noncompliance  Patient Strengths: Ability for insight Average or above average intelligence Capable of independent living Agricultural engineer for treatment/growth Supportive family/friends  Treatment Modalities: Medication Management, Group therapy, Case management,  1 to 1 session with clinician, Psychoeducation, Recreational therapy.   Physician Treatment Plan for Primary Diagnosis: <principal problem not specified> Long Term Goal(s): Improvement in symptoms so as ready for discharge Improvement in symptoms so as ready for discharge   Short Term Goals: Ability to identify changes in lifestyle to reduce recurrence of condition will improve Ability to verbalize feelings will improve Ability to disclose and discuss suicidal ideas Ability to identify and develop effective coping behaviors will improve Compliance with prescribed medications will improve Ability to identify triggers associated with substance abuse/mental health issues  will improve  Medication Management: Evaluate patient's response, side effects, and tolerance of medication regimen.  Therapeutic Interventions: 1 to 1 sessions, Unit Group sessions and Medication administration.  Evaluation of Outcomes: Not Met  Physician Treatment Plan for Secondary Diagnosis: Active Problems:   MDD (major depressive disorder), recurrent, severe, with psychosis ([place])   Major depression with  psychotic features ([place])  Long Term Goal(s): Improvement in symptoms so as ready for discharge Improvement in symptoms so as ready for discharge   Short Term Goals: Ability to identify changes in lifestyle to reduce recurrence of condition will improve Ability to verbalize feelings will improve Ability to disclose and discuss suicidal ideas Ability to identify and develop effective coping behaviors will improve Compliance with prescribed medications will improve Ability to identify triggers associated with substance abuse/mental health issues will improve     Medication Management: Evaluate patient's response, side effects, and tolerance of medication regimen.  Therapeutic Interventions: 1 to 1 sessions, Unit Group sessions and Medication administration.  Evaluation of Outcomes: Not Met   RN Treatment Plan for Primary Diagnosis: <principal problem not specified> Long Term Goal(s): Knowledge of disease and therapeutic regimen to maintain health will improve  Short Term Goals: Ability to participate in decision making will improve, Ability to disclose and discuss suicidal ideas, Ability to identify and develop effective coping behaviors will improve and Compliance with prescribed medications will improve  Medication Management: RN will administer medications as ordered by provider, will assess and evaluate patient's response and provide education to patient for prescribed medication. RN will report any adverse and/or side effects to prescribing provider.  Therapeutic Interventions: 1 on 1 counseling sessions, Psychoeducation, Medication administration, Evaluate responses to treatment, Monitor vital signs and CBGs as ordered, Perform/monitor CIWA, COWS, AIMS and Fall Risk screenings as ordered, Perform wound care treatments as ordered.  Evaluation of Outcomes: Not Met   LCSW Treatment Plan for Primary Diagnosis: <principal problem not specified> Long Term Goal(s): Safe transition to  appropriate next level of care at discharge, Engage patient in therapeutic group addressing interpersonal concerns.  Short Term Goals: Engage patient in aftercare planning with referrals and resources  Therapeutic Interventions: Assess for all discharge needs, 1 to 1 time with Social worker, Explore available resources and support systems, Assess for adequacy in community support network, Educate family and significant other(s) on suicide prevention, Complete Psychosocial Assessment, Interpersonal group therapy.  Evaluation of Outcomes: Not Met   Progress in Treatment: Attending groups: No. New to unit Participating in groups: No. Taking medication as prescribed: Yes. Toleration medication: Yes. Family/Significant other contact made: No, will contact:  if patient consents to collateral contacts Patient understands diagnosis: Yes. Discussing patient identified problems/goals with staff: Yes. Medical problems stabilized or resolved: Yes. Denies suicidal/homicidal ideation: No. Continues to endorse passive SI Issues/concerns per patient self-inventory: No. Other:   New problem(s) identified: None   New Short Term/Long Term Goal(s): Detox, medication stabilization, elimination of SI thoughts, development of comprehensive mental wellness plan.    Patient Goals:    Discharge Plan or Barriers: Patient recently admitted. CSW will continue to follow and assess for appropriate referrals and possible discharge planning.    Reason for Continuation of Hospitalization: Anxiety Depression Hallucinations Medication stabilization Suicidal ideation  Estimated Length of Stay: 3-5 days   Attendees: Patient: 05/04/2020 9:06 AM  Physician:  05/04/2020 9:06 AM  Nursing:  05/04/2020 9:06 AM  RN Care Manager: 05/04/2020 9:06 AM  Social Worker: Radonna Ricker, LCSW 05/04/2020 9:06 AM  Recreational Therapist:  05/04/2020  9:06 AM  Other:  05/04/2020 9:06 AM  Other:  05/04/2020 9:06 AM  Other: 05/04/2020  9:06 AM    Scribe for Treatment Team: Marylee Floras, [place] 05/04/2020 9:06 AM
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
## 2020-05-04 NOTE — Progress Notes (Signed)
   05/04/20 2140  Psych Admission Type (Psych Patients Only)  Admission Status Voluntary  Psychosocial Assessment  Patient Complaints Anxiety;Depression  Eye Contact Fair  Facial Expression Anxious  Affect Anxious;Depressed  Speech Soft;Logical/coherent  Interaction Assertive  Motor Activity Other (Comment) (WNL)  Appearance/Hygiene Unremarkable  Behavior Characteristics Cooperative;Anxious  Mood Anxious;Pleasant  Thought Process  Coherency WDL  Content WDL  Delusions None reported or observed  Perception Hallucinations  Hallucination Auditory;Command (voices earlier in day but denies them at evening med pass 2140)  Judgment Poor  Confusion None  Danger to Self  Current suicidal ideation? Denies  Danger to Others  Danger to Others None reported or observed   Pt seen at med window. Pt rates anxiety 5/10. States that anxiety was worse earlier in the day when command AH were loud. Pt states that she was given medication that helped voices to go away and also reduce her anxiety. Pt given evening meds as well as Trazodone 50 mg and Vistaril 25 mg for anxiety and sleep.

## 2020-05-04 NOTE — H&P (Signed)
Psychiatric Admission Assessment Adult  Patient Identification: Brianna Cordova  MRN:  [id number]  Date of Evaluation:  05/04/2020  Chief Complaint: Worsening depression & suicidal ideations   Principal Diagnosis: MDD (major depressive disorder), single episode, severe with psychotic features ([place])  Diagnosis:  Principal Problem:   MDD (major depressive disorder), single episode, severe with psychotic features ([place]) Active Problems:   MDD (major depressive disorder), recurrent, severe, with psychosis ([place])   Major depression with psychotic features ([place])  History of Present Illness: (Per Md's admission SRA notes): Patient is a 29 year old female with a reported history of bipolar disorder who presented to the [facility] on 05/03/2020 with worsening depression and suicidal ideation.  The patient stated she had been diagnosed with bipolar disorder around the age of 33, and had been placed on medication but was unable to remember exactly which medication this was.  She stated that she had been married at age 75, and her husband at that time encouraged her to stop her medicines.  She has been off the medicines for at least 2 years.  She most recently has been living with her parents.  She stated that she has been having paralyzing anxiety and depression.  She feels like a failure.  She stated her family wants her to get a job and be successful but she feels as though she is unable to do any of those things.  She admitted to auditory hallucinations which were located in her head.  During the interview today she became progressively more anxious, and tearful.  She required a as needed of Risperdal with lorazepam to calm her down.  Since then she has done a little bit better and calm her.  She was admitted to the hospital for evaluation and stabilization.  Associated Signs/Symptoms:  Depression Symptoms:  depressed mood, insomnia, feelings of  worthlessness/guilt, hopelessness, suicidal thoughts without plan, anxiety,  (Hypo) Manic Symptoms:  Impulsivity, Irritable Mood, Labiality of Mood,  Anxiety Symptoms:  Excessive Worry,  Psychotic Symptoms:  Hallucinations: Auditory Command:  "The voices tell to go kill myself".  PTSD Symptoms: NA  Total Time spent with patient: 1 hour  Past Psychiatric History: Bipolar disorder.  Is the patient at risk to self? No.  Has the patient been a risk to self in the past 6 months? Yes.    Has the patient been a risk to self within the distant past? Yes.    Is the patient a risk to others? No.  Has the patient been a risk to others in the past 6 months? No.  Has the patient been a risk to others within the distant past? No.   Prior Inpatient Therapy: Yes [facility]) Prior Outpatient Therapy: Yes, but does not go top her follow-up appointments.  Alcohol Screening: 1. How often do you have a drink containing alcohol?: Monthly or less 2. How many drinks containing alcohol do you have on a typical day when you are drinking?: 1 or 2 3. How often do you have six or more drinks on one occasion?: Never AUDIT-C Score: 1 4. How often during the last year have you found that you were not able to stop drinking once you had started?: Never 5. How often during the last year have you failed to do what was normally expected from you because of drinking?: Never 6. How often during the last year have you needed a first drink in the morning to get yourself going after a heavy drinking session?: Never 7.  How often during the last year have you had a feeling of guilt of remorse after drinking?: Never 8. How often during the last year have you been unable to remember what happened the night before because you had been drinking?: Never 9. Have you or someone else been injured as a result of your drinking?: No 10. Has a relative or friend or a doctor or another health worker been concerned about your drinking or  suggested you cut down?: No Alcohol Use Disorder Identification Test Final Score (AUDIT): 1  Substance Abuse History in the last 12 months:  No.  Consequences of Substance Abuse: NA  Previous Psychotropic Medications: Yes, (Citalopram  Psychological Evaluations: No   Past Medical History:  Past Medical History:  Diagnosis Date  . Anxiety   . Depression   . Epilepsy ([place])    last seizure age 56  . Migraine     Past Surgical History:  Procedure Laterality Date  . WISDOM TOOTH EXTRACTION  Age 19   Family History:  Family History  Problem Relation Age of Onset  . Diabetes Mother   . Hypertension Father    Family Psychiatric  History: None reported  Tobacco Screening: Does not smoke or use tobacco products.  Social History:  Social History   Substance and Sexual Activity  Alcohol Use No  . Alcohol/week: 0.0 standard drinks   Comment: wine "2 times out of the year"     Social History   Substance and Sexual Activity  Drug Use No    Additional Social History:  Allergies:   Allergies  Allergen Reactions  . Black Pepper [[place]] Swelling    Swelling of eyes and become very watery, swelling of tongue   Lab Results:  Results for orders placed or performed during the hospital encounter of 05/03/20 (from the past 48 hour(s))  POC SARS Coronavirus 2 Ag     Status: None   Collection Time: 05/03/20 12:31 PM  Result Value Ref Range   SARS Coronavirus 2 Ag NEGATIVE NEGATIVE    Comment: Performed at [facility], [address], [place], [place] [address]  POC SARS Coronavirus 2 Ag-ED - Nasal Swab (BD Veritor Kit)     Status: None (Preliminary result)   Collection Time: 05/03/20 12:38 PM  Result Value Ref Range   SARS Coronavirus 2 Ag Negative Negative  Pregnancy, urine POC     Status: None   Collection Time: 05/03/20  4:26 PM  Result Value Ref Range   Preg Test, Ur NEGATIVE NEGATIVE    Comment:        THE SENSITIVITY OF THIS METHODOLOGY IS >24 mIU/mL   CBC      Status: None   Collection Time: 05/03/20  5:00 PM  Result Value Ref Range   WBC 7.4 4.0 - 10.5 K/uL   RBC 4.75 3.87 - 5.11 MIL/uL   Hemoglobin 12.6 12.0 - 15.0 g/dL   HCT 38.6 36 - 46 %   MCV 81.3 80.0 - 100.0 fL   MCH 26.5 26.0 - 34.0 pg   MCHC 32.6 30.0 - 36.0 g/dL   RDW 11.9 11.5 - 15.5 %   Platelets 219 150 - 400 K/uL   nRBC 0.0 0.0 - 0.2 %    Comment: Performed at [facility], [place] [address], [place], [place] [address]  Lipid panel     Status: Abnormal   Collection Time: 05/03/20  5:00 PM  Result Value Ref Range   Cholesterol 213 (H)  0 - 200 mg/dL   Triglycerides 29 <150 mg/dL   HDL 91 >40 mg/dL   Total CHOL/HDL Ratio 2.3 RATIO   VLDL 6 0 - 40 mg/dL   LDL Cholesterol 116 (H) 0 - 99 mg/dL    Comment:        Total Cholesterol/HDL:CHD Risk Coronary Heart Disease Risk Table                     Men   Women  1/2 Average Risk   3.4   3.3  Average Risk       5.0   4.4  2 X Average Risk   9.6   7.1  3 X Average Risk  23.4   11.0        Use the calculated Patient Ratio above and the CHD Risk Table to determine the patient's CHD Risk.        ATP III CLASSIFICATION (LDL):  <100     mg/dL   Optimal  100-129  mg/dL   Near or Above                    Optimal  130-159  mg/dL   Borderline  160-189  mg/dL   High  >190     mg/dL   Very High Performed at [facility] [address], [place], [place] [address]   Hemoglobin A1c     Status: None   Collection Time: 05/03/20  5:00 PM  Result Value Ref Range   Hgb A1c MFr Bld 5.1 4.8 - 5.6 %    Comment: REPEATED TO VERIFY (NOTE) Pre diabetes:          5.7%-6.4%  Diabetes:              >6.4%  Glycemic control for   <7.0% adults with diabetes    Mean Plasma Glucose 99.67 mg/dL    Comment: Performed at [facility] [address], [place], [place] [address]  Comprehensive metabolic panel     Status: Abnormal   Collection Time: 05/03/20  5:00 PM  Result Value Ref Range   Sodium 134 (L) 135 - 145 mmol/L    Potassium 4.0 3.5 - 5.1 mmol/L   Chloride 102 98 - 111 mmol/L   CO2 25 22 - 32 mmol/L   Glucose, Bld 85 70 - 99 mg/dL    Comment: Glucose reference range applies only to samples taken after fasting for at least 8 hours.   BUN 10 6 - 20 mg/dL   Creatinine, Ser 0.86 0.44 - 1.00 mg/dL   Calcium 9.2 8.9 - 10.3 mg/dL   Total Protein 7.3 6.5 - 8.1 g/dL   Albumin 4.3 3.5 - 5.0 g/dL   AST 15 15 - 41 U/L   ALT 13 0 - 44 U/L   Alkaline Phosphatase 44 38 - 126 U/L   Total Bilirubin 1.3 (H) 0.3 - 1.2 mg/dL   GFR calc non Af Amer >60 >60 mL/min   GFR calc Af Amer >60 >60 mL/min   Anion gap 7 5 - 15    Comment: Performed at [facility] [address], [place], [place] [address]  TSH     Status: None   Collection Time: 05/03/20  5:00 PM  Result Value Ref Range   TSH 1.254 0.350 - 4.500 uIU/mL    Comment: Performed by a 3rd Generation assay with a functional sensitivity of <=0.01 uIU/mL. Performed at [facility]  [facility], [place] [address], [place], [place] [address]   POCT Urine Drug Screen - (ICup)     Status: Normal   Collection Time: 05/03/20  5:08 PM  Result Value Ref Range   POC Amphetamine UR None Detected None Detected   POC Secobarbital (BAR) None Detected None Detected   POC Buprenorphine (BUP) None Detected None Detected   POC Oxazepam (BZO) None Detected None Detected   POC Cocaine UR None Detected None Detected   POC Methamphetamine UR None Detected None Detected   POC Morphine None Detected None Detected   POC Oxycodone UR None Detected None Detected   POC Methadone UR None Detected None Detected   POC Marijuana UR None Detected None Detected   Blood Alcohol level:  Lab Results  Component Value Date   ETH <11 78/46/9629   Metabolic Disorder Labs:  Lab Results  Component Value Date   HGBA1C 5.1 05/03/2020   MPG 99.67 05/03/2020   No results found for: PROLACTIN Lab Results  Component Value Date   CHOL 213 (H) 05/03/2020   TRIG 29 05/03/2020   HDL 91  05/03/2020   CHOLHDL 2.3 05/03/2020   VLDL 6 05/03/2020   LDLCALC 116 (H) 05/03/2020   Current Medications: Current Facility-Administered Medications  Medication Dose Route Frequency Provider Last Rate Last Admin  . acetaminophen (TYLENOL) tablet 650 mg  650 mg Oral Q6H PRN Sharma Covert, MD      . alum & mag hydroxide-simeth (MAALOX/MYLANTA) 200-200-20 MG/5ML suspension 30 mL  30 mL Oral Q4H PRN Sharma Covert, MD      . citalopram (CELEXA) tablet 10 mg  10 mg Oral Daily Sharma Covert, MD   10 mg at 05/04/20 5284  . hydrOXYzine (ATARAX/VISTARIL) tablet 25 mg  25 mg Oral TID PRN Sharma Covert, MD      . magnesium hydroxide (MILK OF MAGNESIA) suspension 30 mL  30 mL Oral Daily PRN Sharma Covert, MD      . risperiDONE (RISPERDAL) tablet 1 mg  1 mg Oral QHS Sharma Covert, MD      . traZODone (DESYREL) tablet 50 mg  50 mg Oral QHS PRN Sharma Covert, MD       PTA Medications: Medications Prior to Admission  Medication Sig Dispense Refill Last Dose  . cetirizine (ZYRTEC) 10 MG tablet Take 1 tablet (10 mg total) by mouth daily. (Patient not taking: Reported on 04/01/2015) 30 tablet 0 Not Taking at Unknown time  . citalopram (CELEXA) 20 MG tablet Take 1 and 1/2 tab daily (Patient not taking: Reported on 05/04/2020) 45 tablet 2 Not Taking at Unknown time  . cyclobenzaprine (FLEXERIL) 10 MG tablet Take 1 tablet (10 mg total) by mouth 3 (three) times daily as needed for muscle spasms. (Patient not taking: Reported on 08/20/2016) 12 tablet 0 Not Taking at Unknown time  . HYDROcodone-acetaminophen (NORCO/VICODIN) 5-325 MG tablet Take 1 tablet by mouth every 4 (four) hours as needed. (Patient not taking: Reported on 05/04/2020) 6 tablet 0 Not Taking at Unknown time  . ibuprofen (ADVIL,MOTRIN) 600 MG tablet Take 1 tablet (600 mg total) by mouth every 8 (eight) hours as needed. (Patient not taking: Reported on 08/20/2016) 15 tablet 0 Not Taking at Unknown time  . ondansetron  (ZOFRAN) 4 MG tablet Take 1 tablet (4 mg total) by mouth every 6 (six) hours. (Patient not taking: Reported on 05/04/2020) 12 tablet 0 Not Taking at Unknown time  . potassium chloride SA (  K-DUR,KLOR-CON) 20 MEQ tablet Take 1 tablet (20 mEq total) by mouth 2 (two) times daily. (Patient not taking: Reported on 05/04/2020) 14 tablet 0 Not Taking at Unknown time  . risperiDONE (RISPERDAL) 1 MG tablet Take 1/2 to 1 tab at bed time (Patient not taking: Reported on 05/04/2020) 30 tablet 2 Not Taking at Unknown time  . tamsulosin (FLOMAX) 0.4 MG CAPS capsule Take 1 capsule (0.4 mg total) by mouth daily. (Patient not taking: Reported on 05/04/2020) 7 capsule 0 Not Taking at Unknown time   Musculoskeletal: Strength & Muscle Tone: within normal limits Gait & Station: unsteady Patient leans: N/A  Psychiatric Specialty Exam: Physical Exam  Nursing note and vitals reviewed. Constitutional: She is oriented to person, place, and time.  HENT:  Head: Normocephalic.  Nose: Nose normal.  Mouth/Throat: Oropharynx is clear.  Eyes: Pupils are equal, round, and reactive to light.  Cardiovascular: Normal rate and normal pulses.  Respiratory: Effort normal.  Genitourinary:    Genitourinary Comments: Deferred   Musculoskeletal:        General: Normal range of motion.     Cervical back: Normal range of motion.  Neurological: She is alert and oriented to person, place, and time.  Skin: Skin is warm and dry.    Review of Systems  Constitutional: Negative for chills, diaphoresis and fever.  HENT: Negative for congestion, rhinorrhea, sinus pressure, sneezing and sore throat.   Eyes: Negative for discharge.  Respiratory: Negative for cough, chest tightness, shortness of breath and wheezing.   Cardiovascular: Negative for chest pain and palpitations.  Gastrointestinal: Negative for diarrhea, nausea and vomiting.  Endocrine: Negative for cold intolerance.  Genitourinary: Negative for difficulty urinating.   Musculoskeletal: Negative for arthralgias and myalgias.  Allergic/Immunologic: Positive for food allergies (Black pepper). Negative for environmental allergies.       Allergies: NKDA  Neurological: Negative for dizziness, tremors, seizures, syncope, speech difficulty, light-headedness, numbness and headaches.  Psychiatric/Behavioral: Positive for dysphoric mood, sleep disturbance and suicidal ideas. Negative for agitation, behavioral problems, confusion, decreased concentration and self-injury. The patient is nervous/anxious. The patient is not hyperactive.     Blood pressure 130/87, pulse 86, temperature 98.4 F (36.9 C), temperature source Oral, resp. rate 16, height 5' 1"  (1.549 m), weight 72.6 kg, SpO2 98 %.Body mass index is 30.23 kg/m.  General Appearance: Disheveled  Eye Contact:  Fair  Speech:  Pressured  Volume:  Increased  Mood:  Anxious, Depressed and Dysphoric  Affect:  Congruent  Thought Process:  Goal Directed and Descriptions of Associations: Intact  Orientation:  Full (Time, Place, and Person)  Thought Content:  Rumination  Suicidal Thoughts:  Yes.  without intent/plan  Homicidal Thoughts:  No  Memory:  Immediate;   Fair Recent;   Fair Remote;   Fair  Judgement:  Impaired  Insight:  Fair  Psychomotor Activity:  Increased  Concentration:  Concentration: Poor and Attention Span: Poor  Recall:  [employer] of Knowledge:  Fair  Language:  Good  Akathisia:  Negative  Handed:  Right  AIMS (if indicated):     Assets:  Desire for Improvement Resilience  ADL's:  Intact  Cognition:  WNL    Sleep:  Number of Hours: 5.75   Treatment Plan Summary: Daily contact with patient to assess and evaluate symptoms and progress in treatment and Medication management.  Treatment Plan/Recommendations:  1. Admit for crisis management and stabilization, estimated length of stay 3-5 days.    2. Medication management to reduce current symptoms to  base line and improve the patient's  overall level of functioning: See MAR, Md's SRA & treatment plan.   Observation Level/Precautions:  15 minute checks  Laboratory:  Per ED  Psychotherapy: Group sessions  Medications: See [facility]   Consultations: As needed   Discharge Concerns: Safety, mood stability   Estimated LOS: 3-5 days  Other: Admit to the [facility].    Physician Treatment Plan for Primary Diagnosis: MDD (major depressive disorder), single episode, severe with psychotic features ([place])  Long Term Goal(s): Improvement in symptoms so as ready for discharge  Short Term Goals: Ability to identify changes in lifestyle to reduce recurrence of condition will improve, Ability to verbalize feelings will improve and Ability to disclose and discuss suicidal ideas  Physician Treatment Plan for Secondary Diagnosis: Principal Problem:   MDD (major depressive disorder), single episode, severe with psychotic features ([place]) Active Problems:   MDD (major depressive disorder), recurrent, severe, with psychosis ([place])   Major depression with psychotic features ([place])  Long Term Goal(s): Improvement in symptoms so as ready for discharge  Short Term Goals: Ability to identify and develop effective coping behaviors will improve, Compliance with prescribed medications will improve and Ability to identify triggers associated with substance abuse/mental health issues will improve  I certify that inpatient services furnished can reasonably be expected to improve the patient's condition.    Lindell Spar, NP, PMHNP, FNP-BC 6/23/20211:30 PM

## 2020-05-04 NOTE — Tx Team (Signed)
Initial Treatment Plan 05/04/2020 3:10 AM Lowell Bouton PZZ:[id number]    PATIENT STRESSORS: Financial difficulties Marital or family conflict Medication change or noncompliance   PATIENT STRENGTHS: Ability for insight Average or above average intelligence Capable of independent living Communication skills Motivation for treatment/growth Supportive family/friends   PATIENT IDENTIFIED PROBLEMS: Passive SI with no plan  "no motivation"  AH "why bother living anymore, die, no one loves you"  "can't hold a job, no license, living with my parents"  "being dependent"             DISCHARGE CRITERIA:  Improved stabilization in mood, thinking, and/or behavior Medical problems require only outpatient monitoring Motivation to continue treatment in a less acute level of care Verbal commitment to aftercare and medication compliance  PRELIMINARY DISCHARGE PLAN: Attend PHP/IOP Outpatient therapy Return to previous living arrangement  PATIENT/FAMILY INVOLVEMENT: This treatment plan has been presented to and reviewed with the patient, Brianna Cordova.  The patient and family have been given the opportunity to ask questions and make suggestions.  Ephraim Hamburger, RN 05/04/2020, 3:10 AM

## 2020-05-04 NOTE — Progress Notes (Signed)
   05/03/20 2045  COVID-19 Daily Checkoff  Have you had a fever (temp > 37.80C/100F)  in the past 24 hours?  No  COVID-19 EXPOSURE  Have you traveled outside the state in the past 14 days? No  Have you been in contact with someone with a confirmed diagnosis of COVID-19 or PUI in the past 14 days without wearing appropriate PPE? No  Have you been living in the same home as a person with confirmed diagnosis of COVID-19 or a PUI (household contact)? No  Have you been diagnosed with COVID-19? No

## 2020-05-04 NOTE — Progress Notes (Signed)
[facility] Group Notes:  (Nursing/MHT/Case Management/Adjunct)  Date:  05/04/2020  Time:  10:42 PM  Type of Therapy:  Wrap up   Participation Level:  Active  Participation Quality:  Appropriate  Affect:  Appropriate  Cognitive:  Appropriate  Insight:  Appropriate  Engagement in Group:  Engaged  Modes of Intervention:Problem solving  Summary of Progress/Problems: Pt. Rated her day a 8 out of 10.  She shared how she wanted to work on goals that will prepare her to deal with her issues.  Her goal was to to stop hearing voices and she felt that was achieved.  She found listening to music, arts and crafts are a few of her coping skills.  Brianna Cordova 05/04/2020, 10:42 PM

## 2020-05-04 NOTE — [facility] Group Notes (Signed)
LCSW Group Therapy Notes  Type of Therapy and Topic: Group Therapy: Healthy Vs. Unhealthy Coping Strategies   Description of Group:  In this group, patients will be encouraged to explore their healthy and unhealthy coping strategics. Coping strategies are actions that we take to deal with stress, problems, or uncomfortable emotions in our daily lives. Each patient will be challenged to read some scenarios and discuss the unhealthy and healthy coping strategies within those scenarios. Also, each patient will be challenged to describe current healthy and unhealthy strategies that they use in their own lives and discuss the outcomes and barriers to those strategies. This group will be process-oriented, with patients participating in exploration of their own experiences as well as giving and receiving support and challenge from other group members.  Therapeutic Goals: 1. Patient will identify personal healthy and unhealthy coping strategies. 2. Patient will identify healthy and unhealthy coping strategies, in others, through scenarios.  3. Patient will identify expected outcomes of healthy and unhealthy coping strategies. 4. Patient will identify barriers to using healthy coping strategies.   Summary of Patient Progress: This is a worksheet based group. Individual feedback and the opportunity to process the content was provided.   Therapeutic Modalities:  Cognitive Behavioral Therapy Solution Focused Therapy Motivational Interviewing   Kollins Fenter, MSW, LCSW Clinical Social Worker [facility]  Phone: [phone]      
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
## 2020-05-05 NOTE — Progress Notes (Signed)
Adult Psychoeducational Group Note  Date:  05/05/2020 Time:  11:44 PM  Group Topic/Focus:  Wrap-Up Group:   The focus of this group is to help patients review their daily goal of treatment and discuss progress on daily workbooks.  Participation Level:  Active  Participation Quality:  Appropriate  Affect:  Appropriate  Cognitive:  Appropriate  Insight: Appropriate  Engagement in Group:  Engaged  Modes of Intervention:  Discussion  Additional Comments:  Pt attend wrap up group her day was a 8. The one positive thin that happen to day finish what she was concentration pn. Its hard for her to focus and finish taks.  Brianna Cordova Long 05/05/2020, 11:44 PM

## 2020-05-05 NOTE — Progress Notes (Signed)
[facility] MD Progress Note  05/05/2020 12:29 PM Brianna Cordova  MRN:  [id number]  Subjective: Brianna Cordova reports, "I'm doing much better than when I came to the hospital. The medications has helped me calm down a whole lot".  Objective: Patient is a 29 year old female with a reported history of bipolar disorder who presented to the [facility] on 05/03/2020 with worsening depression and suicidal ideation.  The patient stated she had been diagnosed with bipolar disorder around the age of 67, and had been placed on medication but was unable to remember exactly which medication this was.  She stated that she had been married at age 7, and her husband at that time encouraged her to stop her medicines.  She has been off the medicines for at least 2 years.   Brianna Cordova is seen, chart reviewed. The chart findings discussed with the treatment team. She is seen sitting in the day interacting with the other patients. She presents with an improved affect & eye contact. She says she is doing much better than when she came to the hospital. She says her current medication regimen has helped her calm down a lot. She says she slept well last night. Is visible on the unit, attending group session. She is taking & tolerating her treatment regimen. Denies any side effects. She denies any symptoms of anxiety. She denies any new issues or complaints. She denies any SIHI, AVH, delusional thoughts or paranoia. She does not appear to be responding to any internal stimuli. Brianna Cordova is in agreement to continue current plan of care as already in progress.  Principal Problem: MDD (major depressive disorder), single episode, severe with psychotic features ([place])  Diagnosis: Principal Problem:   MDD (major depressive disorder), single episode, severe with psychotic features ([place]) Active Problems:   MDD (major depressive disorder), recurrent, severe, with psychosis ([place])   Major depression with psychotic features  ([place])  Total Time spent with patient: 25 minutes  Past Psychiatric History: See H&P  Past Medical History:  Past Medical History:  Diagnosis Date  . Anxiety   . Depression   . Epilepsy ([place])    last seizure age 56  . Migraine     Past Surgical History:  Procedure Laterality Date  . WISDOM TOOTH EXTRACTION  Age 69   Family History:  Family History  Problem Relation Age of Onset  . Diabetes Mother   . Hypertension Father    Family Psychiatric  History: See H&P  Social History:  Social History   Substance and Sexual Activity  Alcohol Use No  . Alcohol/week: 0.0 standard drinks   Comment: wine "2 times out of the year"     Social History   Substance and Sexual Activity  Drug Use No    Social History   Socioeconomic History  . Marital status: Single    Spouse name: Not on file  . Number of children: Not on file  . Years of education: Not on file  . Highest education level: Not on file  Occupational History  . Occupation: Student    Comment: [employer]  Tobacco Use  . Smoking status: Never Smoker  . Smokeless tobacco: Never Used  Vaping Use  . Vaping Use: Never used  Substance and Sexual Activity  . Alcohol use: No    Alcohol/week: 0.0 standard drinks    Comment: wine "2 times out of the year"  . Drug use: No  . Sexual activity: Not Currently    Partners: Male  Birth control/protection: None  Other Topics Concern  . Not on file  Social History Narrative   02/18/2013 [facility]  Dayrin was born in [place], [place], and grew up in [place], [place]. She is an only child. She reports that she had a "rough childhood." She did not connect well with people, and had very few friends. She reports that she was teased and tolerated an elementary school. She denies any physical, emotional, or sexual abuse. She is currently a Chartered certified accountant at [employer], and she works part-time at [employer]. She is currently  living with her mother and father in [place]. She denies any legal problems she affiliates as Primary school teacher. She reports that her social support system consists of her mom, dad, on, and therapist. 02/18/2013 [facility]   Social Determinants of Health   Financial Resource Strain:   . Difficulty of Paying Living Expenses:   Food Insecurity:   . Worried About Charity fundraiser in the Last Year:   . Arboriculturist in the Last Year:   Transportation Needs:   . Film/video editor (Medical):   Marland Kitchen Lack of Transportation (Non-Medical):   Physical Activity:   . Days of Exercise per Week:   . Minutes of Exercise per Session:   Stress:   . Feeling of Stress :   Social Connections:   . Frequency of Communication with Friends and Family:   . Frequency of Social Gatherings with Friends and Family:   . Attends Religious Services:   . Active Member of Clubs or Organizations:   . Attends Archivist Meetings:   Marland Kitchen Marital Status:    Additional Social History:   Sleep: Good  Appetite:  Good  Current Medications: Current Facility-Administered Medications  Medication Dose Route Frequency Provider Last Rate Last Admin  . acetaminophen (TYLENOL) tablet 650 mg  650 mg Oral Q6H PRN Sharma Covert, MD      . alum & mag hydroxide-simeth (MAALOX/MYLANTA) 200-200-20 MG/5ML suspension 30 mL  30 mL Oral Q4H PRN Sharma Covert, MD      . citalopram (CELEXA) tablet 10 mg  10 mg Oral Daily Sharma Covert, MD   10 mg at 05/05/20 0845  . hydrOXYzine (ATARAX/VISTARIL) tablet 25 mg  25 mg Oral TID PRN Sharma Covert, MD   25 mg at 05/04/20 2140  . magnesium hydroxide (MILK OF MAGNESIA) suspension 30 mL  30 mL Oral Daily PRN Sharma Covert, MD      . risperiDONE (RISPERDAL) tablet 1 mg  1 mg Oral QHS Sharma Covert, MD   1 mg at 05/04/20 2139  . traZODone (DESYREL) tablet 50 mg  50 mg Oral QHS PRN Sharma Covert, MD   50 mg at 05/04/20 2142   Lab Results:  Results for orders  placed or performed during the hospital encounter of 05/03/20 (from the past 48 hour(s))  POC SARS Coronavirus 2 Ag     Status: None   Collection Time: 05/03/20 12:31 PM  Result Value Ref Range   SARS Coronavirus 2 Ag NEGATIVE NEGATIVE    Comment: Performed at [facility], [address] [place], [place] [address]  POC SARS Coronavirus 2 Ag-ED - Nasal Swab (BD Veritor Kit)     Status: None (Preliminary result)   Collection Time: 05/03/20 12:38 PM  Result Value Ref Range   SARS Coronavirus 2 Ag Negative Negative  Pregnancy, urine POC     Status: None  Collection Time: 05/03/20  4:26 PM  Result Value Ref Range   Preg Test, Ur NEGATIVE NEGATIVE    Comment:        THE SENSITIVITY OF THIS METHODOLOGY IS >24 mIU/mL   CBC     Status: None   Collection Time: 05/03/20  5:00 PM  Result Value Ref Range   WBC 7.4 4.0 - 10.5 K/uL   RBC 4.75 3.87 - 5.11 MIL/uL   Hemoglobin 12.6 12.0 - 15.0 g/dL   HCT 38.6 36 - 46 %   MCV 81.3 80.0 - 100.0 fL   MCH 26.5 26.0 - 34.0 pg   MCHC 32.6 30.0 - 36.0 g/dL   RDW 11.9 11.5 - 15.5 %   Platelets 219 150 - 400 K/uL   nRBC 0.0 0.0 - 0.2 %    Comment: Performed at [facility], [place] [address], [place], [place] [address]  Lipid panel     Status: Abnormal   Collection Time: 05/03/20  5:00 PM  Result Value Ref Range   Cholesterol 213 (H) 0 - 200 mg/dL   Triglycerides 29 <150 mg/dL   HDL 91 >40 mg/dL   Total CHOL/HDL Ratio 2.3 RATIO   VLDL 6 0 - 40 mg/dL   LDL Cholesterol 116 (H) 0 - 99 mg/dL    Comment:        Total Cholesterol/HDL:CHD Risk Coronary Heart Disease Risk Table                     Men   Women  1/2 Average Risk   3.4   3.3  Average Risk       5.0   4.4  2 X Average Risk   9.6   7.1  3 X Average Risk  23.4   11.0        Use the calculated Patient Ratio above and the CHD Risk Table to determine the patient's CHD Risk.        ATP III CLASSIFICATION (LDL):  <100     mg/dL   Optimal  100-129  mg/dL   Near or Above                     Optimal  130-159  mg/dL   Borderline  160-189  mg/dL   High  >190     mg/dL   Very High Performed at [facility] [address], [place], [place] [address]   Hemoglobin A1c     Status: None   Collection Time: 05/03/20  5:00 PM  Result Value Ref Range   Hgb A1c MFr Bld 5.1 4.8 - 5.6 %    Comment: REPEATED TO VERIFY (NOTE) Pre diabetes:          5.7%-6.4%  Diabetes:              >6.4%  Glycemic control for   <7.0% adults with diabetes    Mean Plasma Glucose 99.67 mg/dL    Comment: Performed at [facility] [address] [place], [place] [address]  Comprehensive metabolic panel     Status: Abnormal   Collection Time: 05/03/20  5:00 PM  Result Value Ref Range   Sodium 134 (L) 135 - 145 mmol/L   Potassium 4.0 3.5 - 5.1 mmol/L   Chloride 102 98 - 111 mmol/L   CO2 25 22 - 32 mmol/L   Glucose, Bld 85 70 - 99 mg/dL    Comment: Glucose reference  range applies only to samples taken after fasting for at least 8 hours.   BUN 10 6 - 20 mg/dL   Creatinine, Ser 0.86 0.44 - 1.00 mg/dL   Calcium 9.2 8.9 - 10.3 mg/dL   Total Protein 7.3 6.5 - 8.1 g/dL   Albumin 4.3 3.5 - 5.0 g/dL   AST 15 15 - 41 U/L   ALT 13 0 - 44 U/L   Alkaline Phosphatase 44 38 - 126 U/L   Total Bilirubin 1.3 (H) 0.3 - 1.2 mg/dL   GFR calc non Af Amer >60 >60 mL/min   GFR calc Af Amer >60 >60 mL/min   Anion gap 7 5 - 15    Comment: Performed at [facility] [address] [place], [place] [address]  TSH     Status: None   Collection Time: 05/03/20  5:00 PM  Result Value Ref Range   TSH 1.254 0.350 - 4.500 uIU/mL    Comment: Performed by a 3rd Generation assay with a functional sensitivity of <=0.01 uIU/mL. Performed at [facility], [place] [address] [place], [place] [address]   POCT Urine Drug Screen - (ICup)     Status: Normal   Collection Time: 05/03/20  5:08 PM  Result Value Ref Range   POC Amphetamine UR None Detected None Detected   POC Secobarbital (BAR) None  Detected None Detected   POC Buprenorphine (BUP) None Detected None Detected   POC Oxazepam (BZO) None Detected None Detected   POC Cocaine UR None Detected None Detected   POC Methamphetamine UR None Detected None Detected   POC Morphine None Detected None Detected   POC Oxycodone UR None Detected None Detected   POC Methadone UR None Detected None Detected   POC Marijuana UR None Detected None Detected   Blood Alcohol level:  Lab Results  Component Value Date   ETH <11 02/58/5277   Metabolic Disorder Labs: Lab Results  Component Value Date   HGBA1C 5.1 05/03/2020   MPG 99.67 05/03/2020   No results found for: PROLACTIN Lab Results  Component Value Date   CHOL 213 (H) 05/03/2020   TRIG 29 05/03/2020   HDL 91 05/03/2020   CHOLHDL 2.3 05/03/2020   VLDL 6 05/03/2020   LDLCALC 116 (H) 05/03/2020   Physical Findings: AIMS:  , ,  ,  ,    CIWA:  CIWA-Ar Total: 8 COWS:     Musculoskeletal: Strength & Muscle Tone: within normal limits Gait & Station: normal Patient leans: N/A  Psychiatric Specialty Exam: Physical Exam  Nursing note and vitals reviewed. Constitutional: She is oriented to person, place, and time.  HENT:  Head: Normocephalic.  Nose: Nose normal.  Mouth/Throat: Oropharynx is clear.  Eyes: Pupils are equal, round, and reactive to light.  Cardiovascular: Normal rate and normal pulses.  Respiratory: Effort normal.  Genitourinary:    Genitourinary Comments: Deefrred   Musculoskeletal:        General: Normal range of motion.     Cervical back: Normal range of motion.  Neurological: She is oriented to person, place, and time.  Skin: Skin is warm and dry.    Review of Systems  Constitutional: Negative for chills, diaphoresis and fever.  HENT: Negative for congestion, rhinorrhea, sneezing and sore throat.   Eyes: Negative for discharge.  Respiratory: Negative for cough, chest tightness, shortness of breath and wheezing.   Cardiovascular: Negative for  chest pain and palpitations.  Gastrointestinal: Negative for diarrhea, nausea and vomiting.  Endocrine: Negative for cold intolerance.  Genitourinary: Negative for difficulty urinating.  Musculoskeletal: Negative for arthralgias and myalgias.  Skin: Negative.   Allergic/Immunologic: Positive for food allergies (Black pepper). Negative for environmental allergies.       Allergies: NKDA  Neurological: Negative for dizziness, tremors, seizures, syncope, speech difficulty, weakness, light-headedness and headaches.  Psychiatric/Behavioral: Positive for dysphoric mood ("Improving"). Negative for agitation, behavioral problems, confusion, decreased concentration, hallucinations, self-injury, sleep disturbance and suicidal ideas. The patient is not nervous/anxious and is not hyperactive.     Blood pressure 108/72, pulse 96, temperature 98.6 F (37 C), temperature source Oral, resp. rate 16, height _0  (1.549 m), weight 72.6 kg, SpO2 98 %.Body mass index is 30.23 kg/m.  General Appearance: Casual  Eye Contact: Good  Speech: Normal  Volume: Normal  Mood: Euthymic  Affect:  Congruent  Thought Process:  Goal Directed and Descriptions of Associations: Intact  Orientation:  Full (Time, Place, and Person)  Thought Content:  Rumination, but improving  Suicidal Thoughts: currently denies any thoughts, plans or intent.  Homicidal Thoughts:  No  Memory:  Immediate;   Fair Recent;   Fair Remote;   Fair  Judgement: Fair  Insight:  Fair  Psychomotor Activity:  Normal  Concentration:  Concentration: Poor and Attention Span: Poor  Recall:  [employer] of Knowledge:  Fair  Language:  Good  Akathisia:  Negative  Handed:  Right  AIMS (if indicated):     Assets:  Desire for Improvement Resilience  ADL's:  Intact  Cognition:  WNL    Sleep:  Number of Hours: 6.5   Treatment Plan Summary: Daily contact with patient to assess and evaluate symptoms and progress in treatment and Medication  management.  - Continue inpatient hospitalization. - Will continue today 05/05/2020 plan as below except where it is noted.  Depression.    - Continue Citalopram 10 mg po daily. Mood control.    - Continue Risperdal 1 mg po Q hs. Anxiety.     - Continue Vistaril 25 mg po tid prn Insomnia    - Continue Trazodone 50 mg po Q hs prn.  Encourage group participation. Discharge disposition plan in progress.  Lindell Spar, NP, PMHNP, FNP-BC 05/05/2020, 12:29 PM

## 2020-05-05 NOTE — Progress Notes (Addendum)
   05/05/20 0627  Vital Signs  Pulse Rate 96  Resp 16  BP 108/72  BP Location Left Arm  BP Method Automatic  Patient Position (if appropriate) Standing   D:  Patient out in open areas and took all of her medicine. Patient rated anxiety 1/10. Patient reported that "...slept like a baby." Pt. Denied SI/HI/AVH. A:  Patient took scheduled medicine.  Support and encouragement provided Routine safety checks conducted every 15 minutes. Patient  Informed to notify staff with any concerns.   R:  Safety maintained.

## 2020-05-05 NOTE — Progress Notes (Signed)
   05/05/20 2215  Psych Admission Type (Psych Patients Only)  Admission Status Voluntary  Psychosocial Assessment  Patient Complaints None  Eye Contact Fair  Facial Expression Animated  Affect Appropriate to circumstance  Speech Soft;Logical/coherent  Interaction Assertive  Motor Activity Other (Comment) (WNL)  Appearance/Hygiene Unremarkable  Behavior Characteristics Cooperative  Mood Pleasant  Thought Process  Coherency WDL  Content WDL  Delusions None reported or observed  Perception WDL  Hallucination None reported or observed  Judgment Poor  Confusion None  Danger to Self  Current suicidal ideation? Denies  Danger to Others  Danger to Others None reported or observed   Pt states that she has had a good day. Pt denies SI, HI, AVH and pain. Pt states that trazodone made her "sleep like a log" last night. Denies anxiety and has not experienced AH today. Pt given evening meds as well as Trazodone again for sleep.

## 2020-05-06 NOTE — Progress Notes (Signed)
Recreation Therapy Notes  Date:  6.25.21 Time: 0930 Location: [address] Dayroom  Group Topic: Stress Management  Goal Area(s) Addresses:  Patient will identify positive stress management techniques. Patient will identify benefits of using stress management post d/c.  Behavioral Response: Engaged  Intervention: Stress Management  Activity : Guided Imagery.  LRT read a script that allowed patients to envision their peaceful place.  Patients were to listen and follow along as script was read to engage in activity.  Education:  Stress Management, Discharge Planning.   Education Outcome: Acknowledges Education  Clinical Observations/Feedback: Pt attended and participated in activity.     Caroll Rancher, LRT/CTRS     Caroll Rancher A 05/06/2020 11:43 AM

## 2020-05-06 NOTE — Progress Notes (Signed)
[facility] Note  05/06/2020 12:55 PM Brianna Cordova  MRN:  [id number]  Subjective: Brianna Cordova reports, "It is going very well. I don't feel depressed or anxious any more. I'm learning coping skills to use after discharge".  Objective: Patient is a 29 year old female with a reported history of bipolar disorder who presented to the [facility] on 05/03/2020 with worsening depression and suicidal ideation.  The patient stated she had been diagnosed with bipolar disorder around the age of 34, and had been placed on medication but was unable to remember exactly which medication this was.  She stated that she had been married at age 50, and her husband at that time encouraged her to stop her medicines.  She has been off the medicines for at least 2 years.   Brianna Cordova is seen, chart reviewed. The chart findings discussed with the treatment team. She is seen sitting in the day interacting with the other patients. She presents with a cheerful affect & eye contact. She says she continues doing much better than when she came to the hospital. She says her current medication regimen has helped her calm down a lot. She says she slept well last night. Is visible on the unit, attending group session. She is taking & tolerating her treatment regimen. Denies any side effects. She denies any symptoms of anxiety or depression. She says she is learning coping skills to use after discharge to deal with her emotions. She also says she is learning to value her self-worth from here onwards. She denies any new issues or complaints. She denies any SIHI, AVH, delusional thoughts or paranoia. She does not appear to be responding to any internal stimuli. Brianna Cordova is in agreement to continue current plan of care as already in progress. Patient appears to be getting ready for discharge to continue mental health care & medication management on an outpatient basis.  Principal Problem: MDD (major depressive  disorder), single episode, severe with psychotic features (HCC)  Diagnosis: Principal Problem:   MDD (major depressive disorder), single episode, severe with psychotic features (HCC) Active Problems:   MDD (major depressive disorder), recurrent, severe, with psychosis (HCC)   Major depression with psychotic features (HCC)  Total Time spent with patient: 25 minutes  Past Psychiatric History: See H&P  Past Medical History:  Past Medical History:  Diagnosis Date  . Anxiety   . Depression   . Epilepsy (HCC)    last seizure age 42  . Migraine     Past Surgical History:  Procedure Laterality Date  . WISDOM TOOTH EXTRACTION  Age 9   Family History:  Family History  Problem Relation Age of Onset  . Diabetes Mother   . Hypertension Father    Family Psychiatric  History: See H&P  Social History:  Social History   Substance and Sexual Activity  Alcohol Use No  . Alcohol/week: 0.0 standard drinks   Comment: wine "2 times out of the year"     Social History   Substance and Sexual Activity  Drug Use No    Social History   Socioeconomic History  . Marital status: Single    Spouse name: Not on file  . Number of children: Not on file  . Years of education: Not on file  . Highest education level: Not on file  Occupational History  . Occupation: Student    Comment: [employer]  Tobacco Use  . Smoking status: Never Smoker  . Smokeless tobacco: Never Used  Vaping Use  .  Vaping Use: Never used  Substance and Sexual Activity  . Alcohol use: No    Alcohol/week: 0.0 standard drinks    Comment: wine "2 times out of the year"  . Drug use: No  . Sexual activity: Not Currently    Partners: Male    Birth control/protection: None  Other Topics Concern  . Not on file  Social History Narrative   02/18/2013 [facility]  Brianna Cordova was born in [place], [place], and grew up in [place], [place]. She is an only child. She reports that she had a "rough childhood." She did not connect  well with people, and had very few friends. She reports that she was teased and tolerated an elementary school. She denies any physical, emotional, or sexual abuse. She is currently a Public librarian at [employer], and she works part-time at [employer]. She is currently living with her mother and father in [place]. She denies any legal problems she affiliates as Personal assistant. She reports that her social support system consists of her mom, dad, on, and therapist. 02/18/2013 [facility]   Social Determinants of Health   Financial Resource Strain:   . Difficulty of Paying Living Expenses:   Food Insecurity:   . Worried About Programme researcher, broadcasting/film/video in the Last Year:   . Barista in the Last Year:   Transportation Needs:   . Freight forwarder (Medical):   Marland Kitchen Lack of Transportation (Non-Medical):   Physical Activity:   . Days of Exercise per Week:   . Minutes of Exercise per Session:   Stress:   . Feeling of Stress :   Social Connections:   . Frequency of Communication with Friends and Family:   . Frequency of Social Gatherings with Friends and Family:   . Attends Religious Services:   . Active Member of Clubs or Organizations:   . Attends Banker Meetings:   Marland Kitchen Marital Status:    Additional Social History:   Sleep: Good  Appetite:  Good  Current Medications: Current Facility-Administered Medications  Medication Dose Route Frequency Provider Last Rate Last Admin  . acetaminophen (TYLENOL) tablet 650 mg  650 mg Oral Q6H PRN Antonieta Pert, MD      . alum & mag hydroxide-simeth (MAALOX/MYLANTA) 200-200-20 MG/5ML suspension 30 mL  30 mL Oral Q4H PRN Antonieta Pert, MD      . citalopram (CELEXA) tablet 10 mg  10 mg Oral Daily Antonieta Pert, MD   10 mg at 05/06/20 0815  . hydrOXYzine (ATARAX/VISTARIL) tablet 25 mg  25 mg Oral TID PRN Antonieta Pert, MD   25 mg at 05/04/20 2140  . magnesium hydroxide (MILK OF  MAGNESIA) suspension 30 mL  30 mL Oral Daily PRN Antonieta Pert, MD      . risperiDONE (RISPERDAL) tablet 1 mg  1 mg Oral QHS Antonieta Pert, MD   1 mg at 05/05/20 2217  . traZODone (DESYREL) tablet 50 mg  50 mg Oral QHS PRN Antonieta Pert, MD   50 mg at 05/05/20 2218   Lab Results:  No results found for this or any previous visit (from the past 48 hour(s)). Blood Alcohol level:  Lab Results  Component Value Date   [facility] <11 07/15/2012   Metabolic Disorder Labs: Lab Results  Component Value Date   HGBA1C 5.1 05/03/2020   MPG 99.67 05/03/2020   No results found for: PROLACTIN Lab Results  Component Value Date  CHOL 213 (H) 05/03/2020   TRIG 29 05/03/2020   HDL 91 05/03/2020   CHOLHDL 2.3 05/03/2020   VLDL 6 05/03/2020   LDLCALC 116 (H) 05/03/2020   Physical Findings: AIMS:  , ,  ,  ,    CIWA:  CIWA-Ar Total: 8 COWS:     Musculoskeletal: Strength & Muscle Tone: within normal limits Gait & Station: normal Patient leans: N/A  Psychiatric Specialty Exam: Physical Exam  Nursing note and vitals reviewed. Constitutional: She is oriented to person, place, and time.  HENT:  Head: Normocephalic.  Nose: Nose normal.  Mouth/Throat: Oropharynx is clear.  Eyes: Pupils are equal, round, and reactive to light.  Cardiovascular: Normal rate and normal pulses.  Respiratory: Effort normal.  Genitourinary:    Genitourinary Comments: Deefrred   Musculoskeletal:        General: Normal range of motion.     Cervical back: Normal range of motion.  Neurological: She is oriented to person, place, and time.  Skin: Skin is warm and dry.    Review of Systems  Constitutional: Negative for chills, diaphoresis and fever.  HENT: Negative for congestion, rhinorrhea, sneezing and sore throat.   Eyes: Negative for discharge.  Respiratory: Negative for cough, chest tightness, shortness of breath and wheezing.   Cardiovascular: Negative for chest pain and palpitations.   Gastrointestinal: Negative for diarrhea, nausea and vomiting.  Endocrine: Negative for cold intolerance.  Genitourinary: Negative for difficulty urinating.  Musculoskeletal: Negative for arthralgias and myalgias.  Skin: Negative.   Allergic/Immunologic: Positive for food allergies (Black pepper). Negative for environmental allergies.       Allergies: NKDA  Neurological: Negative for dizziness, tremors, seizures, syncope, speech difficulty, weakness, light-headedness and headaches.  Psychiatric/Behavioral: Positive for dysphoric mood ("Improving"). Negative for agitation, behavioral problems, confusion, decreased concentration, hallucinations, self-injury, sleep disturbance and suicidal ideas. The patient is not nervous/anxious and is not hyperactive.     Blood pressure 102/72, pulse (!) 101, temperature 98.4 F (36.9 C), temperature source Oral, resp. rate 16, height 5\' 1"  (1.549 m), weight 72.6 kg, SpO2 98 %.Body mass index is 30.23 kg/m.  General Appearance: Casual  Eye Contact: Good  Speech: Normal  Volume: Normal  Mood: Euthymic  Affect:  Congruent  Thought Process:  Goal Directed and Descriptions of Associations: Intact  Orientation:  Full (Time, Place, and Person)  Thought Content:  Rumination, but improving  Suicidal Thoughts: currently denies any thoughts, plans or intent.  Homicidal Thoughts:  No  Memory:  Immediate;   Fair Recent;   Fair Remote;   Fair  Judgement: Fair  Insight:  Fair  Psychomotor Activity:  Normal  Concentration:  Concentration: Poor and Attention Span: Poor  Recall:  [employer] of Knowledge:  Fair  Language:  Good  Akathisia:  Negative  Handed:  Right  AIMS (if indicated):     Assets:  Desire for Improvement Resilience  ADL's:  Intact  Cognition:  WNL    Sleep:  Number of Hours: 6.75   Treatment Plan Summary: Daily contact with patient to assess and evaluate symptoms and progress in treatment and Medication management.  - Continue  inpatient hospitalization. - Will continue today 05/06/2020 plan as below except where it is noted.  Depression.    - Continue Citalopram 10 mg po daily. Mood control.    - Continue Risperdal 1 mg po Q hs. Anxiety.     - Continue Vistaril 25 mg po tid prn Insomnia    - Continue Trazodone 50 mg po  Q hs prn.  Encourage group participation. Discharge disposition plan in progress.  Armandina Stammer, NP, PMHNP, FNP-BC 05/06/2020, 12:55 PMPatient ID: Lowell Bouton, female   DOB: [birth year], 29 y.o.   MRN: [id number]

## 2020-05-06 NOTE — Progress Notes (Addendum)
Pt is alert and oriented to person, place, time and situation. Pt is calm, cooperative, pleasant, spends time in the dayroom watching tv, putting together a puzzle, sitting quietly, affect is flat but brightens on approach with a smile. Pt denies suicidal and homicidal ideation, denies hallucinations, denies feelings of depression and anxiety. Pt is medication compliant. No distress noted, none reported, pt voices no complaints. Will continue to monitor pt per Q15 face checks and monitor for safety and progress.

## 2020-05-06 NOTE — BHH Group Notes (Signed)
Adult Occupational Therapy Group Note  Date:  05/06/2020 Time:  4:35 PM  Group Topic/Focus:  Self-Esteem  Participation Level:  Active  Participation Quality:  Appropriate, Attentive and Supportive  Affect:  Appropriate  Cognitive:  Alert and Appropriate  Insight: Limited  Engagement in Group:  Engaged and Improving  Modes of Intervention:  Activity, Discussion, Education and Socialization  Additional Comments:    S: "Sometimes it's hard to feel good about yourself when you can't find anything positive. I focus on the negative."  O: OT tx with focus on self esteem building this date. Education given on definition of self esteem, with both causes of low and high self esteem identified. Activity given for pt to identify a positive/aspiring trait for each letter of the alphabet. Pt to work with peers to help complete activity and build positive thinking.   A: Brianna Cordova was active and independent in her participation of activity and discussion, sharing and asking for feedback from staff and peers. Pt shared that she finds it difficult to focus on the positive traits and often looks at what is negative and what she is doing wrong. She was able to identify positive qualities/traits through activity and included trustworthy, determined, and brave. She shared that she was determined and that is why she brought herself to the hospital to get the help she needed.  P: Continue to engage patient in OT groups 2x/week.  Donne Hazel, MOT, OTR/L  05/06/2020, 4:35 PM

## 2020-05-06 NOTE — BHH Group Notes (Signed)
05/06/2020 8:45am Type of Group and Topic: Psychoeducational Group: Discharge Planning  Participation Level: Active  Description of Group Discharge planning group reviews patient's anticipated discharge plans and assists patients to anticipate and address any barriers to wellness/recovery in the community. Suicide prevention education is reviewed with patients in group. Therapeutic Goals 1. Patients will state their anticipated discharge plan and mental health aftercare 2. Patients will identify potential barriers to wellness in the community setting 3. Patients will engage in problem solving, solution focused discussion of ways to anticipate and address barriers to wellness/recovery   Summary of Patient Progress Plan for Discharge/Comments:  Brianna Cordova reports that she does not have any questions or concerns regarding her discharge plans. She states that she plans to return home with her family.   Transportation Means: Patient's parents.    Supports: Family    Therapeutic Modalities: Motivational Interviewing     Baldo Daub, MSW, LCSWA Clinical Social Worker [facility]  Phone: [phone] 05/06/2020 1:51 PM

## 2020-05-07 MED ORDER — TRAZODONE HCL 50 MG PO TABS: 50.0000 mg | ORAL_TABLET | Freq: Every evening | ORAL | 1 refills | Status: DC | PRN

## 2020-05-07 MED ORDER — CITALOPRAM HYDROBROMIDE 10 MG PO TABS: 10.0000 mg | ORAL_TABLET | Freq: Every day | ORAL | 1 refills | Status: DC

## 2020-05-07 MED ORDER — RISPERIDONE 1 MG PO TABS: 1.0000 mg | ORAL_TABLET | Freq: Every day | ORAL | 1 refills | Status: DC

## 2020-05-07 NOTE — Progress Notes (Signed)
Pt denies SI/HI. Pt received both written and verbal discharge instructions. The pt verbalized understanding of the discharge instructions. She agreed to the f/u appt and med regimen. The pt received an AVS, transitional record, SRA and sample medications. She gathered her belongings from her room and all her belongings were returned from the locker. The pt was safely transported to the lobby and picked up by her father.

## 2020-05-07 NOTE — Progress Notes (Signed)
   05/07/20 0204  Charting Type  Charting Type Shift assessment  Orders Chart Check (once per shift) Completed  Safety Check Verification  Has the RN verified the 15 minute safety check completion? Yes  Neurological  Neuro (WDL) WDL  HEENT  HEENT (WDL) WDL  Respiratory  Respiratory (WDL) WDL  Cardiac  Cardiac (WDL) WDL  Vascular  Vascular (WDL) WDL  Integumentary  Integumentary (WDL) WDL  Braden Scale (Ages 8 and up)  Sensory Perceptions 4  Moisture 4  Activity 4  Mobility 4  Nutrition 3  Friction and Shear 3  Braden Scale Score 22  Musculoskeletal  Musculoskeletal (WDL) WDL  Gastrointestinal  Gastrointestinal (WDL) WDL  GU Assessment  Genitourinary (WDL) WDL  D: Patient in dayroom reports she had a good day. Pt reports feeling better since starting new medication regimen. A: Medications administered as prescribed. Support and encouragement provided as needed.  R: Patient remains safe on the unit. Will continue to monitor for safety and stability.

## 2020-05-07 NOTE — B[facility] Suicide Risk Assessment (Signed)
[facility] Discharge Suicide Risk Assessment   Principal Problem: MDD (major depressive disorder), single episode, severe with psychotic features (HCC) Discharge Diagnoses: Principal Problem:   MDD (major depressive disorder), single episode, severe with psychotic features (HCC) Active Problems:   MDD (major depressive disorder), recurrent, severe, with psychosis (HCC)   Major depression with psychotic features (HCC)   Total Time spent with patient: 15 minutes  Musculoskeletal: Strength & Muscle Tone: within normal limits Gait & Station: normal Patient leans: N/A  Psychiatric Specialty Exam: Review of Systems  All other systems reviewed and are negative.   Blood pressure 116/67, pulse 81, temperature 98.1 F (36.7 C), temperature source Oral, resp. rate 16, height 5\' 1"  (1.549 m), weight 72.6 kg, SpO2 98 %.Body mass index is 30.23 kg/m.  General Appearance: Casual  Eye Contact::  Good  Speech:  Normal Rate409  Volume:  Normal  Mood:  Euthymic  Affect:  Congruent  Thought Process:  Coherent and Descriptions of Associations: Intact  Orientation:  Full (Time, Place, and Person)  Thought Content:  Logical  Suicidal Thoughts:  No  Homicidal Thoughts:  No  Memory:  Immediate;   Good Recent;   Good Remote;   Good  Judgement:  Intact  Insight:  Good  Psychomotor Activity:  Normal  Concentration:  Good  Recall:  Good  Fund of Knowledge:Good  Language: Good  Akathisia:  Negative  Handed:  Right  AIMS (if indicated):     Assets:  Desire for Improvement Housing Resilience Social Support  Sleep:  Number of Hours: 6.75  Cognition: WNL  ADL's:  Intact   Mental Status Per Nursing Assessment::   On Admission:  Suicidal ideation indicated by patient  Demographic Factors:  Low socioeconomic status and Unemployed  Loss Factors: Financial problems/change in socioeconomic status  Historical Factors: Impulsivity  Risk Reduction Factors:   Living with another person, especially  a relative and Positive social support  Continued Clinical Symptoms:  Depression:   Impulsivity  Cognitive Features That Contribute To Risk:  None    Suicide Risk:  Minimal: No identifiable suicidal ideation.  Patients presenting with no risk factors but with morbid ruminations; may be classified as minimal risk based on the severity of the depressive symptoms   Follow-up Information    [facility]. Go on 05/12/2020.   Specialty: Behavioral Health Why: You have an appointment for therapy on 05/12/20 at 4:00 pm. This appointment will be held in person.  Please arrive by 3:40 pm for paperwork.  You also have an in person appointment for medication management on 06/08/20 at 3:30 pm. Contact information: [address] [place] [phone]              Plan Of Care/Follow-up recommendations:  Activity:  ad lib  [phone], MD 05/07/2020, 8:57 AM

## 2020-05-07 NOTE — BHH Group Notes (Signed)
Adult Psychoeducational Group Note  Date:  05/07/2020 Time:  11:16 AM  Group Topic/Focus:  Goals Group:   The focus of this group is to help patients establish daily goals to achieve during treatment and discuss how the patient can incorporate goal setting into their daily lives to aide in recovery.  Participation Level:  Active  Participation Quality:  Appropriate  Affect:  Flat  Cognitive:  Oriented  Insight: Good  Engagement in Group:  Engaged  Modes of Intervention:  Discussion, Education and Exploration  Additional Comments:  Pt stated her goal was to write at least 5 positives about herself.  Vira Blanco A 05/07/2020, 11:16 AM

## 2020-05-07 NOTE — Progress Notes (Signed)
  [facility] Adult Case Management Discharge Plan :  Will you be returning to the same living situation after discharge:  Yes,  with parents At discharge, do you have transportation home?: Yes,  fathre Do you have the ability to pay for your medications: No.  Release of information consent forms completed and emailed to Medical Records, then turned in to Medical Records by CSW.   Patient to Follow up at:  Follow-up Information    [facility]. Go on 05/12/2020.   Specialty: Behavioral Health Why: You have an appointment for therapy on 05/12/20 at 4:00 pm. This appointment will be held in person.  Please arrive by 3:40 pm for paperwork.  You also have an in person appointment for medication management on 06/08/20 at 3:30 pm. Contact information: [address] [place] [address] [phone]              Next level of care provider has access to [facility] Link:yes  Safety Planning and Suicide Prevention discussed: Yes,  with father     Has patient been referred to the Quitline?: N/A patient is not a smoker  Patient has been referred for addiction treatment: N/A  Lynnell Chad, LCSW 05/07/2020, 9:44 AM

## 2020-05-07 NOTE — BHH Suicide Risk Assessment (Signed)
BHH INPATIENT:  Family/Significant Other Suicide Prevention Education  Suicide Prevention Education:  Education Completed; Father [facility] [phone],  (name of family member/significant other) has been identified by the patient as the family member/significant other with whom the patient will be residing, and identified as the person(s) who will aid the patient in the event of a mental health crisis (suicidal ideations/suicide attempt).  With written consent from the patient, the family member/significant other has been provided the following suicide prevention education, prior to the and/or following the discharge of the patient.  The suicide prevention education provided includes the following:  Suicide risk factors  Suicide prevention and interventions  National Suicide Hotline telephone number  [facility] assessment telephone number  [facility] Emergency Assistance 911  [facility] and/or Residential Mobile Crisis Unit telephone number  Request made of family/significant other to:  Remove weapons (e.g., guns, rifles, knives), all items previously/currently identified as safety concern.    Remove drugs/medications (over-the-counter, prescriptions, illicit drugs), all items previously/currently identified as a safety concern.  The family member/significant other verbalizes understanding of the suicide prevention education information provided.  The family member/significant other agrees to remove the items of safety concern listed above.  Carloyn Jaeger Grossman-Orr 05/07/2020, 9:44 AM

## 2020-05-07 NOTE — Discharge Summary (Signed)
Physician Discharge Summary Note  Patient:  Brianna Cordova is an 29 y.o., female MRN:  [id number] DOB:  1991-10-28 Patient phone:  [phone] (home)  Patient address:   [address] [place] [address],  Total Time spent with patient: 15 minutes  Date of Admission:  05/03/2020 Date of Discharge: 05/07/20  Reason for Admission:  suicidal ideation  Principal Problem: MDD (major depressive disorder), single episode, severe with psychotic features [facility]) Discharge Diagnoses: Principal Problem:   MDD (major depressive disorder), single episode, severe with psychotic features (HCC) Active Problems:   MDD (major depressive disorder), recurrent, severe, with psychosis (HCC)   Major depression with psychotic features (HCC)   Past Psychiatric History: Bipolar disorder  Past Medical History:  Past Medical History:  Diagnosis Date  . Anxiety   . Depression   . Epilepsy (HCC)    last seizure age 32  . Migraine     Past Surgical History:  Procedure Laterality Date  . WISDOM TOOTH EXTRACTION  Age 39   Family History:  Family History  Problem Relation Age of Onset  . Diabetes Mother   . Hypertension Father    Family Psychiatric  History: Father with alcohol use disorder, sober for years. Social History:  Social History   Substance and Sexual Activity  Alcohol Use No  . Alcohol/week: 0.0 standard drinks   Comment: wine "2 times out of the year"     Social History   Substance and Sexual Activity  Drug Use No    Social History   Socioeconomic History  . Marital status: Single    Spouse name: Not on file  . Number of children: Not on file  . Years of education: Not on file  . Highest education level: Not on file  Occupational History  . Occupation: Student    Comment: [employer]  Tobacco Use  . Smoking status: Never Smoker  . Smokeless tobacco: Never Used  Vaping Use  . Vaping Use: Never used  Substance and Sexual Activity  . Alcohol use: No     Alcohol/week: 0.0 standard drinks    Comment: wine "2 times out of the year"  . Drug use: No  . Sexual activity: Not Currently    Partners: Male    Birth control/protection: None  Other Topics Concern  . Not on file  Social History Narrative   02/18/2013 A[facility]  Brayli was born in [place], [place], and grew up in [place], [place]. She is an only child. She reports that she had a "rough childhood." She did not connect well with people, and had very few friends. She reports that she was teased and tolerated an elementary school. She denies any physical, emotional, or sexual abuse. She is currently a Public librarian at [employer], and she works part-time at [employer]. She is currently living with her mother and father in [place]. She denies any legal problems she affiliates as Personal assistant. She reports that her social support system consists of her mom, dad, on, and therapist. 02/18/2013 AHW   Social Determinants of Health   Financial Resource Strain:   . Difficulty of Paying Living Expenses:   Food Insecurity:   . Worried About Programme researcher, broadcasting/film/video in the Last Year:   . Barista in the Last Year:   Transportation Needs:   . Freight forwarder (Medical):   Marland Kitchen Lack of Transportation (Non-Medical):   Physical Activity:   . Days of Exercise per Week:   .  Minutes of Exercise per Session:   Stress:   . Feeling of Stress :   Social Connections:   . Frequency of Communication with Friends and Family:   . Frequency of Social Gatherings with Friends and Family:   . Attends Religious Services:   . Active Member of Clubs or Organizations:   . Attends Archivist Meetings:   Marland Kitchen Marital Status:     Hospital Course:  From admission H&P: Patient is a 29 year old female with a reported history of bipolar disorder who presented to the [facility] on 05/03/2020 with worsening depression and  suicidal ideation. The patient stated she had been diagnosed with bipolar disorder around the age of 83, and had been placed on medication but was unable to remember exactly which medication this was. She stated that she had been married at age 53, and her husband at that time encouraged her to stop her medicines. She has been off the medicines for at least 2 years. She most recently has been living with her parents. She stated that she has been having paralyzing anxiety and depression. She feels like a failure. She stated her family wants her to get a job and be successful but she feels as though she is unable to do any of those things. She admitted to auditory hallucinations which were located in her head. During the interview today she became progressively more anxious, and tearful. She required a as needed of Risperdal with lorazepam to calm her down. Since then she has done a little bit better and calm her. She was admitted to the hospital for evaluation and stabilization.  Ms. Ciolino was admitted for suicidal ideation. She remained on the [facility] unit for four days. Celexa and Risperdal were restarted. Trazodone was started. She participated in group therapy on the unit. She responded well to treatment with no adverse effects reported. She has shown improved mood, affect, sleep, and interaction. She denies any SI/HI/AVH and contracts for safety. She is discharging on the medications listed below. She agrees to follow up at [facility] (see below). Patient is provided with prescriptions and medication samples upon discharge. Her parents are picking her up for discharge home.  Physical Findings: AIMS:  , ,  ,  ,    CIWA:  CIWA-Ar Total: 8 COWS:     Musculoskeletal: Strength & Muscle Tone: within normal limits Gait & Station: normal Patient leans: N/A  Psychiatric Specialty Exam: Physical Exam Vitals and nursing note reviewed.  Constitutional:      Appearance: She is  well-developed.  Cardiovascular:     Rate and Rhythm: Normal rate.  Pulmonary:     Effort: Pulmonary effort is normal.  Neurological:     Mental Status: She is alert and oriented to person, place, and time.     Review of Systems  Constitutional: Negative.   Gastrointestinal: Negative for diarrhea, nausea and vomiting.  Neurological: Negative for tremors and headaches.  Psychiatric/Behavioral: Negative for agitation, behavioral problems, confusion, decreased concentration, dysphoric mood, hallucinations, self-injury, sleep disturbance and suicidal ideas. The patient is not nervous/anxious and is not hyperactive.     Blood pressure 116/67, pulse 81, temperature 98.1 F (36.7 C), temperature source Oral, resp. rate 16, height 5\' 1"  (1.549 m), weight 72.6 kg, SpO2 98 %.Body mass index is 30.23 kg/m.  See MD's discharge SRA      Has this patient used any form of tobacco in the last 30 days? (Cigarettes, Smokeless Tobacco, Cigars, and/or Pipes)  No  Blood Alcohol level:  Lab Results  Component Value Date   [facility] <11 07/15/2012    Metabolic Disorder Labs:  Lab Results  Component Value Date   HGBA1C 5.1 05/03/2020   MPG 99.67 05/03/2020   No results found for: PROLACTIN Lab Results  Component Value Date   CHOL 213 (H) 05/03/2020   TRIG 29 05/03/2020   HDL 91 05/03/2020   CHOLHDL 2.3 05/03/2020   VLDL 6 05/03/2020   LDLCALC 116 (H) 05/03/2020    See Psychiatric Specialty Exam and Suicide Risk Assessment completed by Attending Physician prior to discharge.  Discharge destination:  Home  Is patient on multiple antipsychotic therapies at discharge:  No   Has Patient had three or more failed trials of antipsychotic monotherapy by history:  No  Recommended Plan for Multiple Antipsychotic Therapies: NA  Discharge Instructions    Discharge instructions   Complete by: As directed    Patient is instructed to take all prescribed medications as recommended. Report any side  effects or adverse reactions to your outpatient psychiatrist. Patient is instructed to abstain from alcohol and illegal drugs while on prescription medications. In the event of worsening symptoms, patient is instructed to call the crisis hotline, 911, or go to the nearest emergency department for evaluation and treatment.     Allergies as of 05/07/2020      Reactions   Black Pepper [piper] Swelling   Swelling of eyes and become very watery, swelling of tongue      Medication List    STOP taking these medications   cetirizine 10 MG tablet Commonly known as: ZYRTEC   cyclobenzaprine 10 MG tablet Commonly known as: FLEXERIL   HYDROcodone-acetaminophen 5-325 MG tablet Commonly known as: NORCO/VICODIN   ibuprofen 600 MG tablet Commonly known as: ADVIL   ondansetron 4 MG tablet Commonly known as: ZOFRAN   potassium chloride SA 20 MEQ tablet Commonly known as: KLOR-CON   tamsulosin 0.4 MG Caps capsule Commonly known as: Flomax     TAKE these medications     Indication  citalopram 10 MG tablet Commonly known as: CELEXA Take 1 tablet (10 mg total) by mouth daily. Start taking on: May 08, 2020 What changed:   medication strength  how much to take  how to take this  when to take this  additional instructions  Indication: Depression   risperiDONE 1 MG tablet Commonly known as: RISPERDAL Take 1 tablet (1 mg total) by mouth at bedtime. What changed:   how much to take  how to take this  when to take this  additional instructions  Indication: MIXED BIPOLAR AFFECTIVE DISORDER   traZODone 50 MG tablet Commonly known as: DESYREL Take 1 tablet (50 mg total) by mouth at bedtime as needed for sleep.  Indication: Trouble Sleeping       Follow-up Information    [facility]. Go on 05/12/2020.   Specialty: Behavioral Health Why: You have an appointment for therapy on 05/12/20 at 4:00 pm. This appointment will be held in person.  Please  arrive by 3:40 pm for paperwork.  You also have an in person appointment for medication management on 06/08/20 at 3:30 pm. Contact information: [address] [place] [address] [phone]              Follow-up recommendations: Activity as tolerated. Diet as recommended by primary care physician. Keep all scheduled follow-up appointments as recommended.   Comments:   Patient is instructed to take  all prescribed medications as recommended. Report any side effects or adverse reactions to your outpatient psychiatrist. Patient is instructed to abstain from alcohol and illegal drugs while on prescription medications. In the event of worsening symptoms, patient is instructed to call the crisis hotline, 911, or go to the nearest emergency department for evaluation and treatment.  Signed: Aldean Baker, NP 05/07/2020, 3:06 PM

## 2020-05-12 ENCOUNTER — Other Ambulatory Visit: Payer: Self-pay

## 2020-05-12 ENCOUNTER — Ambulatory Visit (INDEPENDENT_AMBULATORY_CARE_PROVIDER_SITE_OTHER): Payer: No Payment, Other | Admitting: Clinical

## 2020-05-12 DIAGNOSIS — F333 Major depressive disorder, recurrent, severe with psychotic symptoms: Secondary | ICD-10-CM | POA: Diagnosis not present

## 2020-05-13 NOTE — Progress Notes (Signed)
   THERAPIST PROGRESS NOTE  Session Time: 35 minutes  Participation Level: Active  Behavioral Response: CasualAlertEuthymic  Type of Therapy: Individual Therapy  Treatment Goals addressed: Diagnosis: Depression  Interventions: CBT  Summary:  Brianna Cordova is a 29 y.o. female who presents as a hospital discharge from [facility] (6/22 -05/07/20) for auditory hallucinations, depression, and suicidal ideations.  Client presented oriented times five, appropriately dressed, and friendly. Client denied hallucinations and delusions.  Client reported having depressive symptoms since 29 years old but did not discuss it with her parents due to her mother being ill, her father working more to make money, and she taking more responsibility around the house.  Client reported at 29 years old was her first hospitalization for depression. Client reported in May 2020 she had S.I with a plan but did not follow through. Client reported her depressive episodes occur with auditory hallucinations that tell her to hurt herself. Client reported she does not remember at what age the voices began.  Client reported since she has been discharged she has not heard voices and her mood has been better. Client reported she has been spending more time with her family and taking care of her dog.  Client reported her goals for therapy are to work on self esteem and developing more coping skills.  Client was screened for the following SDOH:    Counselor from 05/12/2020 in [facility]  PHQ-9 Total Score 7        Suicidal/Homicidal: Nowithout intent/plan  Therapist Response:  Therapist checked in with the client and allowed time to focus on her thoughts and feelings.  Therapist worked toward building a therapeutic relationship by using eye contact, empathy, and using active listening. Therapist collaborated with the client to discuss her mental health history. Therapist collaborated with  the client for input on what her goals are for therapy. Therapist completed treatment plan for depression with the clients input. Therapist discussed crisis/ safety planning with the client.     Plan: Return again in 2 weeks for individual therapy.   Diagnosis: Major depressive disorder, recurrent episode, severe with psychotic features     Neena Rhymes [name], LCSW 05/14/2020

## 2020-06-08 ENCOUNTER — Ambulatory Visit (INDEPENDENT_AMBULATORY_CARE_PROVIDER_SITE_OTHER): Payer: No Payment, Other | Admitting: Clinical

## 2020-06-08 ENCOUNTER — Other Ambulatory Visit: Payer: Self-pay

## 2020-06-08 ENCOUNTER — Ambulatory Visit (INDEPENDENT_AMBULATORY_CARE_PROVIDER_SITE_OTHER): Payer: No Payment, Other | Admitting: Psychiatry

## 2020-06-08 ENCOUNTER — Encounter (HOSPITAL_COMMUNITY): Payer: Self-pay | Admitting: Psychiatry

## 2020-06-08 DIAGNOSIS — F3178 Bipolar disorder, in full remission, most recent episode mixed: Secondary | ICD-10-CM | POA: Diagnosis not present

## 2020-06-08 MED ORDER — RISPERIDONE 1 MG PO TABS: 1.0000 mg | ORAL_TABLET | Freq: Every day | ORAL | 1 refills | Status: DC

## 2020-06-08 MED ORDER — CITALOPRAM HYDROBROMIDE 10 MG PO TABS: 10.0000 mg | ORAL_TABLET | Freq: Every day | ORAL | 1 refills | Status: DC

## 2020-06-08 MED ORDER — TRAZODONE HCL 50 MG PO TABS: 50.0000 mg | ORAL_TABLET | Freq: Every evening | ORAL | 1 refills | Status: DC | PRN

## 2020-06-08 NOTE — Progress Notes (Signed)
Psychiatric Initial Adult Assessment   Patient Identification: Brianna Cordova MRN:  [id number] Date of Evaluation:  7/28/202121  Referral Source: [facility]  Chief Complaint:   "  I am doing much better now."  Visit Diagnosis:    ICD-10-CM   1. Bipolar 1 disorder, mixed, full remission (Brianna Cordova)  F31.78     History of Present Illness: This is a 29 year old female with history of bipolar disorder who was recently admitted at [facility] from June 22 to June 26 in the context of worsening depression and suicidal ideations.  She was discharged on Celexa 10 mg, risperidone 1 mg at bedtime, trazodone 50 mg at bedtime.  Today, patient was seen with her father.  Patient reported that she is doing much better now.  She stated that her mood is stable and she is not feeling depressed.  She stated that risperidone and trazodone help her with sleep at night.  She informed that she is working at [employer] and the current medication regimen is helpful with her focusing.  She is able to get everything that she needs to get done at work without any difficulty.  She denied any side effects to the medications. She denies any ongoing symptoms just of depression, hypomania or mania. She denied any auditory or visual hallucinations.  She denied any paranoid delusions.  Patient reported that she is spending a lot of time with her parents and they are communicating better.  She stated that her relationship with them is improved and she is very happy about that.  Her father reported that Brianna Cordova is back to her baseline and family is very pleasantly surprised.  He stated that they had lost her to the illness for such a long time that it is hard for them to believe that she is back to being herself now. Father mentioned that patient is in touch with a young man who he has not met and he is hoping that she will introduce him to the family very soon.  Past Psychiatric History: Bipolar disorder, used to see Dr. Adele Schilder in the  past.  Has history of several psychiatric hospitalizations in the past.  Previous Psychotropic Medications: Yes   Substance Abuse History in the last 12 months:  No.  Consequences of Substance Abuse: NA  Past Medical History:  Past Medical History:  Diagnosis Date  . Anxiety   . Depression   . Epilepsy ([place])    last seizure age 77  . Migraine     Past Surgical History:  Procedure Laterality Date  . WISDOM TOOTH EXTRACTION  Age 17    Family Psychiatric History: denied  Family History:  Family History  Problem Relation Age of Onset  . Diabetes Mother   . Hypertension Father     Social History:   Social History   Socioeconomic History  . Marital status: Single    Spouse name: Not on file  . Number of children: Not on file  . Years of education: Not on file  . Highest education level: Not on file  Occupational History  . Occupation: Student    Comment: [employer]  Tobacco Use  . Smoking status: Never Smoker  . Smokeless tobacco: Never Used  Vaping Use  . Vaping Use: Never used  Substance and Sexual Activity  . Alcohol use: No    Alcohol/week: 0.0 standard drinks    Comment: wine "2 times out of the year"  . Drug use: No  . Sexual activity: Not Currently  Partners: Male    Birth control/protection: None  Other Topics Concern  . Not on file  Social History Narrative   02/18/2013 A[facility]  Brianna Cordova was born in [place], [place], and grew up in [place], [place]. She is an only child. She reports that she had a "rough childhood." She did not connect well with people, and had very few friends. She reports that she was teased and tolerated an elementary school. She denies any physical, emotional, or sexual abuse. She is currently a Chartered certified accountant at [employer], and she works part-time at [employer]. She is currently living with her mother and father in [place]. She denies any legal problems she affiliates as  Primary school teacher. She reports that her social support system consists of her mom, dad, on, and therapist. 02/18/2013 AHW   Social Determinants of Health   Financial Resource Strain:   . Difficulty of Paying Living Expenses:   Food Insecurity:   . Worried About Charity fundraiser in the Last Year:   . Arboriculturist in the Last Year:   Transportation Needs:   . Film/video editor (Medical):   Marland Kitchen Lack of Transportation (Non-Medical):   Physical Activity:   . Days of Exercise per Week:   . Minutes of Exercise per Session:   Stress:   . Feeling of Stress :   Social Connections:   . Frequency of Communication with Friends and Family:   . Frequency of Social Gatherings with Friends and Family:   . Attends Religious Services:   . Active Member of Clubs or Organizations:   . Attends Archivist Meetings:   Marland Kitchen Marital Status:     Additional Social History: Lives with parents, works for [employer]  Allergies:   Allergies  Allergen Reactions  . Black Pepper [Piper] Swelling    Swelling of eyes and become very watery, swelling of tongue    Metabolic Disorder Labs: Lab Results  Component Value Date   HGBA1C 5.1 05/03/2020   MPG 99.67 05/03/2020   No results found for: PROLACTIN Lab Results  Component Value Date   CHOL 213 (H) 05/03/2020   TRIG 29 05/03/2020   HDL 91 05/03/2020   CHOLHDL 2.3 05/03/2020   VLDL 6 05/03/2020   LDLCALC 116 (H) 05/03/2020   Lab Results  Component Value Date   TSH 1.254 05/03/2020    Therapeutic Level Labs: No results found for: LITHIUM No results found for: CBMZ No results found for: VALPROATE  Current Medications: Current Outpatient Medications  Medication Sig Dispense Refill  . citalopram (CELEXA) 10 MG tablet Take 1 tablet (10 mg total) by mouth daily. 30 tablet 1  . risperiDONE (RISPERDAL) 1 MG tablet Take 1 tablet (1 mg total) by mouth at bedtime. 30 tablet 1  . traZODone (DESYREL) 50 MG tablet Take 1 tablet (50 mg total) by mouth  at bedtime as needed for sleep. 30 tablet 1   No current facility-administered medications for this visit.    Musculoskeletal: Strength & Muscle Tone: within normal limits Gait & Station: normal Patient leans: N/A  Psychiatric Specialty Exam: Review of Systems  There were no vitals taken for this visit.There is no height or weight on file to calculate BMI.  General Appearance: Fairly Groomed  Eye Contact:  Good  Speech:  Clear and Coherent and Normal Rate  Volume:  Normal  Mood:  Euthymic  Affect:  Congruent  Thought Process:  Goal Directed and Descriptions of Associations: Intact  Orientation:  Full (Time, Place, and Person)  Thought Content:  Logical  Suicidal Thoughts:  No  Homicidal Thoughts:  No  Memory:  Immediate;   Good Recent;   Good  Judgement:  Fair  Insight:  Fair  Psychomotor Activity:  Normal  Concentration:  Concentration: Good and Attention Span: Good  Recall:  Good  Fund of Knowledge:Good  Language: Good  Akathisia:  Negative  Handed:  Right  AIMS (if indicated):  0  Assets:  Communication Skills Desire for Improvement Financial Resources/Insurance Housing Social Support Talents/Skills Transportation  ADL's:  Intact  Cognition: WNL  Sleep:  Good   Screenings: AUDIT     Admission (Discharged) from 05/03/2020 in [facility]  Alcohol Use Disorder Identification Test Final Score (AUDIT) 1    PHQ2-9     Counselor from 05/12/2020 in [facility]  PHQ-2 Total Score 2  PHQ-9 Total Score 7      Assessment and Plan: Patient appears to be stable on her current regimen. Recommend that she continue taking the same for now. Patient is agreeable to this plan.  1. Bipolar 1 disorder, mixed, full remission (HCC)  - citalopram (CELEXA) 10 MG tablet; Take 1 tablet (10 mg total) by mouth daily.  Dispense: 30 tablet; Refill: 1 - risperiDONE (RISPERDAL) 1 MG tablet; Take 1 tablet (1 mg total) by  mouth at bedtime.  Dispense: 30 tablet; Refill: 1 - traZODone (DESYREL) 50 MG tablet; Take 1 tablet (50 mg total) by mouth at bedtime as needed for sleep.  Dispense: 30 tablet; Refill: 1  Continue individual therapy with Ms. Arby Barrette. Continue same medication regimen. Follow up in 6 weeks.    Nevada Crane, MD 7/28/20212:02 PM

## 2020-06-08 NOTE — Progress Notes (Signed)
   THERAPIST PROGRESS NOTE  Session Time: 45 minutes  Participation Level: Active  Behavioral Response: CasualAlertEuthymic  Type of Therapy: Individual Therapy  Treatment Goals addressed: Diagnosis: Dperession  Interventions: CBT  Summary:  Brianna Cordova is a 29 y.o. female who presents in person for the scheduled session. Client presented oriented times five, appropriately dressed, and friendly. Client denied hallucinations and delusions. Client reported on today she was feeling good. Client reported since the last session she has done well with her medication with no side effects. Client reported she has not had problems with auditory hallucinations since taking the medication.  Client discussed with the therapist she has started a part time job with [employer] going on three weeks now. Client reported she has done well with being self efficient with taking the bus and adjusting to her own schedule. Client reported she uses the time on the bus before work to mentally prepare for the day by listening to music. Client reported she also enjoys crocheting and playing with her dog when she is at home. Client reported since being on her medication she has noticed improvement with thinking clearly as opposed to before when she would isolate and wanting to be left alone. Client reported her parents have said they have to get adjusted to her new upbeat attitude. Client reported she is "seeing things more clearly and able to think more logically".  Client reported she is progressively working on spending more time with her parents and communicating with them more. Client reported she has noticed her parents sometimes are hesitant to ask her questions about how she's doing out of fear of possibly triggering her but she reassures them it is okay to ask.     Suicidal/Homicidal: Nowithout intent/plan  Therapist Response: Therapist began the session by asking the client how she has been  doing. Therapist allowed time to focus on the clients thoughts and feelings. Therapist asked open ended questions to assess her mental health symptoms and compliance with medication regimen. Therapist used CBT to discuss comparison of thought patterns prior to and now being on the medication. Therapist discussed challenging thoughts and positive self talk for mental health activities to practice. Therapist assisted with scheduling for next appointments.     Plan: Return again in 3 weeks for individual therapy.  Diagnosis: Bipolar 1 disorder,mixed, full remission   Neena Rhymes Rufus Beske, LCSW 06/08/2020

## 2020-07-11 ENCOUNTER — Ambulatory Visit (INDEPENDENT_AMBULATORY_CARE_PROVIDER_SITE_OTHER): Payer: No Payment, Other | Admitting: Clinical

## 2020-07-11 ENCOUNTER — Other Ambulatory Visit: Payer: Self-pay

## 2020-07-11 DIAGNOSIS — F3178 Bipolar disorder, in full remission, most recent episode mixed: Secondary | ICD-10-CM
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
## 2020-07-28 ENCOUNTER — Ambulatory Visit (INDEPENDENT_AMBULATORY_CARE_PROVIDER_SITE_OTHER): Payer: No Payment, Other | Admitting: Clinical

## 2020-07-28 ENCOUNTER — Other Ambulatory Visit: Payer: Self-pay

## 2020-07-28 ENCOUNTER — Ambulatory Visit (INDEPENDENT_AMBULATORY_CARE_PROVIDER_SITE_OTHER): Payer: No Payment, Other | Admitting: Psychiatry

## 2020-07-28 ENCOUNTER — Encounter (HOSPITAL_COMMUNITY): Payer: Self-pay | Admitting: Psychiatry

## 2020-07-28 DIAGNOSIS — F3178 Bipolar disorder, in full remission, most recent episode mixed: Secondary | ICD-10-CM | POA: Diagnosis not present

## 2020-07-28 MED ORDER — CITALOPRAM HYDROBROMIDE 10 MG PO TABS: 10.0000 mg | ORAL_TABLET | Freq: Every day | ORAL | 1 refills | Status: DC

## 2020-07-28 MED ORDER — RISPERIDONE 1 MG PO TABS: 1.0000 mg | ORAL_TABLET | Freq: Every day | ORAL | 1 refills | Status: DC

## 2020-07-28 MED ORDER — TRAZODONE HCL 50 MG PO TABS: 50.0000 mg | ORAL_TABLET | Freq: Every evening | ORAL | 1 refills | Status: DC | PRN
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
## 2020-07-30 NOTE — Progress Notes (Signed)
   THERAPIST PROGRESS NOTE  Session Time: 20 minutes  Participation Level: Active  Behavioral Response: CasualAlertEuthymic  Type of Therapy: Individual Therapy  Treatment Goals addressed: Diagnosis: Depression  Interventions: CBT  Summary:  Brianna Cordova is a 29 y.o. female who presents in person for the scheduled session. Client presented oriented times five, appropriately dressed, and friendly. Client denied hallucinations and delusions. Client reported on today feeling well. Client reported since the last session she has continued to do well with her medication and daily activity. Client reported continued improved functioning by working toward getting her license and car tags so she has her own method of transportation. Client reported work is going very good for her also. Client reported after the last session she spoke with the female friend she previously mentioned and had a conversation with him about respecting boundaries so they are able to maintain a good friendship. Overall client reported she is responding well to her medication and "doe snot feel like I have a million thoughts going through my head".  Client discussed with the therapist practicing good self care to keep improved level of physical and mental functioning.      Suicidal/Homicidal: Nowithout intent/plan  Therapist Response:  Therapist began the session by checking in and asking the client how she has been doing since last seen. Therapist actively listened the clients thoughts and feelings. Therapist engaged the client in discussion about her progress in treatment currently and offered feedback. Therapist used CBT by engaging the client in conversation about the effectiveness of her response to treatment, her daily routine, and how she has improved outlook for the future. Therapist assisted with scheduling next appointments.    Plan: Return again in 4 weeks for individual therapy.  Diagnosis: Bipolar  1 disorder, mixed, full remission   Neena Rhymes Junie Engram, LCSW 07/30/2020

## 2020-08-06 ENCOUNTER — Other Ambulatory Visit (HOSPITAL_COMMUNITY): Payer: Self-pay | Admitting: Psychiatry

## 2020-08-06 DIAGNOSIS — F3178 Bipolar disorder, in full remission, most recent episode mixed: Secondary | ICD-10-CM

## 2020-09-27 ENCOUNTER — Ambulatory Visit (INDEPENDENT_AMBULATORY_CARE_PROVIDER_SITE_OTHER): Payer: No Payment, Other | Admitting: Psychiatry

## 2020-09-27 ENCOUNTER — Other Ambulatory Visit: Payer: Self-pay

## 2020-09-27 ENCOUNTER — Ambulatory Visit (INDEPENDENT_AMBULATORY_CARE_PROVIDER_SITE_OTHER): Payer: No Payment, Other | Admitting: Clinical

## 2020-09-27 ENCOUNTER — Encounter (HOSPITAL_COMMUNITY): Payer: Self-pay | Admitting: Psychiatry

## 2020-09-27 VITALS — BP 128/75 | HR 91 | Ht 61.0 in | Wt 162.0 lb

## 2020-09-27 DIAGNOSIS — F3178 Bipolar disorder, in full remission, most recent episode mixed: Secondary | ICD-10-CM

## 2020-09-27 MED ORDER — RISPERIDONE 1 MG PO TABS: 1.0000 mg | ORAL_TABLET | Freq: Every day | ORAL | 2 refills | Status: DC

## 2020-09-27 MED ORDER — CITALOPRAM HYDROBROMIDE 10 MG PO TABS: 10.0000 mg | ORAL_TABLET | Freq: Every day | ORAL | 2 refills | Status: DC

## 2020-09-27 MED ORDER — TRAZODONE HCL 100 MG PO TABS: 100.0000 mg | ORAL_TABLET | Freq: Every day | ORAL | 2 refills | Status: DC
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
## 2020-09-27 NOTE — Progress Notes (Signed)
   THERAPIST PROGRESS NOTE  Session Time: 40 minutes  Participation Level: Active  Behavioral Response: CasualAlertEuthymic  Type of Therapy: Individual Therapy  Treatment Goals addressed: Diagnosis: depression  Interventions: CBT  Summary:  Brianna Cordova is a 29 y.o. female who presents for the scheduled session oriented times five, appropriately dressed, and friendly. Client denied hallucinations and delusions. Client reported on today she met with the doctor and her medication was adjusted to help with her insomnia. Client reported for about the past two months she has been waking up every three hours. Client reported her sleep routine stays consistent including no light and/ or tv or phone playing while she is going to bed. 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 Client reported she also wants to make more money to save up to buy the car from her grandparents and then long term begin saving to buy her own apartment. Client reported she has also begun a relationship with a man 68 years older than her. Client reported although the large age gap she feels comfortable with the pace things are going. Client reported she plans to have her parents meet him soon. Client reported overall she feels that things are in a good place, she is feeling more positive, and things are falling into place.    Suicidal/Homicidal: Nowithout intent/plan  Therapist Response:  Therapist began the session by checking in and asking the client how she has been doing since the last session. Therapist actively listened to the clients thoughts and feelings. Therapist asked  open ended questions to assess the clients current symptoms involving her insomnia. Therapist engaged the client in a conversation about the update of her new relationship and utilized CBT to discuss revisiting her boundaries of what she is and is not comfortable with in a intimate relationship considering her previous relationship. Therapist assigned the client homework to brainstorm her boundaries for do's and don'ts. Therapist assisted with scheduling next appointments.     Plan: Return again in 4 weeks for individual therapy.  Diagnosis: Bipolar 1 disorder, mixed, full remission    Brianna Jubilee Evamae Rowen, LCSW 09/27/2020

## 2020-10-08 ENCOUNTER — Other Ambulatory Visit (HOSPITAL_COMMUNITY): Payer: Self-pay | Admitting: Psychiatry

## 2020-10-08 DIAGNOSIS — F3178 Bipolar disorder, in full remission, most recent episode mixed: Secondary | ICD-10-CM

## 2020-11-10 ENCOUNTER — Encounter (HOSPITAL_COMMUNITY): Payer: Self-pay | Admitting: Emergency Medicine

## 2020-11-10 ENCOUNTER — Other Ambulatory Visit: Payer: Self-pay

## 2020-11-10 ENCOUNTER — Ambulatory Visit (HOSPITAL_COMMUNITY)
Admission: EM | Admit: 2020-11-10 | Discharge: 2020-11-10 | Disposition: A | Discharge status: Home / Self Care | Payer: HRSA Program | Attending: Urgent Care | Admitting: Urgent Care

## 2020-11-10 DIAGNOSIS — R110 Nausea: Secondary | ICD-10-CM | POA: Diagnosis not present

## 2020-11-10 DIAGNOSIS — J069 Acute upper respiratory infection, unspecified: Secondary | ICD-10-CM | POA: Insufficient documentation

## 2020-11-10 DIAGNOSIS — R059 Cough, unspecified: Secondary | ICD-10-CM | POA: Diagnosis present

## 2020-11-10 DIAGNOSIS — U071 COVID-19: Secondary | ICD-10-CM | POA: Diagnosis not present

## 2020-11-10 LAB — SARS CORONAVIRUS 2 (TAT 6-24 HRS): SARS Coronavirus 2: POSITIVE — AB

## 2020-11-10 MED ORDER — PROMETHAZINE-DM 6.25-15 MG/5ML PO SYRP: 5.0000 mL | ORAL_SOLUTION | Freq: Every evening | ORAL | 0 refills | Status: AC | PRN

## 2020-11-10 MED ORDER — PSEUDOEPHEDRINE HCL 60 MG PO TABS
60.0000 mg | ORAL_TABLET | Freq: Three times a day (TID) | ORAL | 0 refills | Status: AC | PRN
Start: 2020-11-10 — End: ?

## 2020-11-10 MED ORDER — BENZONATATE 100 MG PO CAPS: 100.0000 mg | ORAL_CAPSULE | Freq: Three times a day (TID) | ORAL | 0 refills | Status: AC | PRN

## 2020-11-10 MED ORDER — CETIRIZINE HCL 10 MG PO TABS: 10.0000 mg | ORAL_TABLET | Freq: Every day | ORAL | 0 refills | Status: AC

## 2020-11-10 NOTE — ED Provider Notes (Signed)
[facility]   MRN: [id number] DOB: 1991/02/09  Subjective:   Brianna Cordova is a 29 y.o. female presenting for 2-day history of acute onset sinus headache, cough, nausea without vomiting.  Denies fever, chest pain, shortness of breath, body aches.  Patient is COVID vaccinated.  Wants to make sure she does not have this infection.  Has not needed to take any medication for relief.  She is not a smoker, denies history of breathing disorders.  She does have a history of epilepsy.  No current facility-administered medications for this encounter.  Current Outpatient Medications:  .  citalopram (CELEXA) 10 MG tablet, Take 1 tablet (10 mg total) by mouth daily., Disp: 30 tablet, Rfl: 2 .  risperiDONE (RISPERDAL) 1 MG tablet, Take 1 tablet (1 mg total) by mouth at bedtime., Disp: 30 tablet, Rfl: 2 .  traZODone (DESYREL) 100 MG tablet, Take 1 tablet (100 mg total) by mouth at bedtime., Disp: 30 tablet, Rfl: 2   Allergies  Allergen Reactions  . Black Pepper [Piper] Swelling    Swelling of eyes and become very watery, swelling of tongue    Past Medical History:  Diagnosis Date  . Anxiety   . Depression   . Epilepsy (HCC)    last seizure age 31  . Migraine      Past Surgical History:  Procedure Laterality Date  . WISDOM TOOTH EXTRACTION  Age 24    Family History  Problem Relation Age of Onset  . Diabetes Mother   . Hypertension Father     Social History   Tobacco Use  . Smoking status: Never Smoker  . Smokeless tobacco: Never Used  Vaping Use  . Vaping Use: Never used  Substance Use Topics  . Alcohol use: No    Alcohol/week: 0.0 standard drinks    Comment: wine "2 times out of the year"  . Drug use: No    ROS   Objective:   Vitals: BP 128/88 (BP Location: Right Arm)   Pulse (!) 59   Temp 98.4 F (36.9 C) (Oral)   Resp 17   Ht 5\' 1"  (1.549 m)   Wt 165 lb (74.8 kg)   LMP 10/22/2020   SpO2 96%   BMI 31.18 kg/m   Physical  Exam Constitutional:      General: She is not in acute distress.    Appearance: Normal appearance. She is well-developed. She is not ill-appearing, toxic-appearing or diaphoretic.  HENT:     Head: Normocephalic and atraumatic.     Right Ear: Tympanic membrane and ear canal normal. No drainage or tenderness. No middle ear effusion. Tympanic membrane is not erythematous.     Left Ear: Tympanic membrane and ear canal normal. No drainage or tenderness.  No middle ear effusion. Tympanic membrane is not erythematous.     Nose: Nose normal. No congestion or rhinorrhea.     Mouth/Throat:     Mouth: Mucous membranes are moist. No oral lesions.     Pharynx: No pharyngeal swelling, oropharyngeal exudate, posterior oropharyngeal erythema or uvula swelling.     Tonsils: No tonsillar exudate or tonsillar abscesses.     Comments: Slight postnasal drainage overlying pharynx. Eyes:     General: No scleral icterus.       Right eye: No discharge.        Left eye: No discharge.     Extraocular Movements: Extraocular movements intact.     Right eye: Normal extraocular motion.  Left eye: Normal extraocular motion.     Conjunctiva/sclera: Conjunctivae normal.     Pupils: Pupils are equal, round, and reactive to light.  Cardiovascular:     Rate and Rhythm: Normal rate and regular rhythm.     Pulses: Normal pulses.     Heart sounds: Normal heart sounds. No murmur heard. No friction rub. No gallop.   Pulmonary:     Effort: Pulmonary effort is normal. No respiratory distress.     Breath sounds: Normal breath sounds. No stridor. No wheezing, rhonchi or rales.  Musculoskeletal:     Cervical back: Normal range of motion and neck supple.  Lymphadenopathy:     Cervical: No cervical adenopathy.  Skin:    General: Skin is warm and dry.     Findings: No rash.  Neurological:     General: No focal deficit present.     Mental Status: She is alert and oriented to person, place, and time.  Psychiatric:         Mood and Affect: Mood normal.        Behavior: Behavior normal.        Thought Content: Thought content normal.        Judgment: Judgment normal.     Assessment and Plan :   PDMP not reviewed this encounter.  1. Viral URI with cough   2. Nausea     Will manage for viral illness such as viral URI, viral syndrome, viral rhinitis, COVID-19. Counseled patient on nature of COVID-19 including modes of transmission, diagnostic testing, management and supportive care.  Offered scripts for symptomatic relief. COVID 19 testing is pending. Counseled patient on potential for adverse effects with medications prescribed/recommended today, ER and return-to-clinic precautions discussed, patient verbalized understanding.     Wallis Bamberg, [place] 11/10/20 [phone]

## 2020-11-10 NOTE — ED Triage Notes (Signed)
Patient c/o nonproductive cough, headache, and nausea x 2 days.   Patient denies fever at home.   Patient hasn't taken any medication or tried alternative methods.

## 2020-11-10 NOTE — Discharge Instructions (Signed)

## 2020-11-21 ENCOUNTER — Ambulatory Visit (HOSPITAL_COMMUNITY): Payer: No Payment, Other | Admitting: Clinical

## 2020-12-06 ENCOUNTER — Ambulatory Visit (HOSPITAL_COMMUNITY): Payer: Self-pay | Admitting: Clinical

## 2020-12-28 ENCOUNTER — Encounter (HOSPITAL_COMMUNITY): Payer: Self-pay | Admitting: Psychiatry

## 2020-12-28 ENCOUNTER — Other Ambulatory Visit: Payer: Self-pay

## 2020-12-28 ENCOUNTER — Ambulatory Visit (INDEPENDENT_AMBULATORY_CARE_PROVIDER_SITE_OTHER): Payer: No Payment, Other | Admitting: Psychiatry

## 2020-12-28 VITALS — BP 125/70 | HR 76 | Ht 61.0 in | Wt 171.4 lb

## 2020-12-28 DIAGNOSIS — F3178 Bipolar disorder, in full remission, most recent episode mixed: Secondary | ICD-10-CM | POA: Diagnosis not present

## 2020-12-28 MED ORDER — RISPERIDONE 1 MG PO TABS
1.0000 mg | ORAL_TABLET | Freq: Every day | ORAL | 2 refills | Status: DC
Start: 2020-12-28 — End: 2021-01-20

## 2020-12-28 MED ORDER — CITALOPRAM HYDROBROMIDE 10 MG PO TABS: 10.0000 mg | ORAL_TABLET | Freq: Every day | ORAL | 2 refills | Status: DC

## 2020-12-28 MED ORDER — TRAZODONE HCL 100 MG PO TABS: 100.0000 mg | ORAL_TABLET | Freq: Every day | ORAL | 2 refills | Status: DC

## 2020-12-28 NOTE — Progress Notes (Signed)
BH MD/PA/NP OP Progress Note  12/28/2020 10:14 AM Brianna Cordova  MRN:  [id number]  Chief Complaint: " I am doing really good."  HPI: Patient reported that she is doing well.  She informed that things have been going really well for her.  She mentioned that she had a great Christmas holiday and she went to [place] with her boyfriend and had a great time there.  She is now working for [employer] and is liking her new position.  She informed that it is only 10 minutes away from where she lives and that makes it very convenient for her. She mentioned that her relationship with her boyfriend is going well. Her symptoms of mania and depression are in remission.  Her mood has been stable and she denies any side effects to her medications.  She found increasing the dose of trazodone to be helpful for sleep.  She informed that she is off for the next few days and she is looking forward to spending some quality time with her boyfriend.   Visit Diagnosis:    ICD-10-CM   1. Bipolar 1 disorder, mixed, full remission (HCC)  F31.78     Past Psychiatric History: Bipolar disorder, used to see Dr. Lolly Mustache in the past.  Has history of several psychiatric hospitalizations in the past.  Past Medical History:  Past Medical History:  Diagnosis Date  . Anxiety   . Depression   . Epilepsy (HCC)    last seizure age 53  . Migraine     Past Surgical History:  Procedure Laterality Date  . WISDOM TOOTH EXTRACTION  Age 51    Family Psychiatric History: denied  Family History:  Family History  Problem Relation Age of Onset  . Diabetes Mother   . Hypertension Father     Social History:  Social History   Socioeconomic History  . Marital status: Single    Spouse name: Not on file  . Number of children: Not on file  . Years of education: Not on file  . Highest education level: Not on file  Occupational History  . Occupation: Student    Comment: [employer]  Tobacco Use  . Smoking status: Never  Smoker  . Smokeless tobacco: Never Used  Vaping Use  . Vaping Use: Never used  Substance and Sexual Activity  . Alcohol use: No    Alcohol/week: 0.0 standard drinks    Comment: wine "2 times out of the year"  . Drug use: No  . Sexual activity: Not Currently    Partners: Male    Birth control/protection: None  Other Topics Concern  . Not on file  Social History Narrative   02/18/2013 [facility]  Shamari was born in [place], [place], and grew up in [place], [place]. She is an only child. She reports that she had a "rough childhood." She did not connect well with people, and had very few friends. She reports that she was teased and tolerated an elementary school. She denies any physical, emotional, or sexual abuse. She is currently a Public librarian at [employer], and she works part-time at [employer]. She is currently living with her mother and father in [place]. She denies any legal problems she affiliates as Personal assistant. She reports that her social support system consists of her mom, dad, on, and therapist. 02/18/2013 A[facility]   Social Determinants of Health   Financial Resource Strain: Not on file  Food Insecurity: Not on file  Transportation Needs: Not on file  Physical Activity: Not on file  Stress: Not on file  Social Connections: Not on file    Allergies:  Allergies  Allergen Reactions  . Black Pepper [Piper] Swelling    Swelling of eyes and become very watery, swelling of tongue    Metabolic Disorder Labs: Lab Results  Component Value Date   HGBA1C 5.1 05/03/2020   MPG 99.67 05/03/2020   No results found for: PROLACTIN Lab Results  Component Value Date   CHOL 213 (H) 05/03/2020   TRIG 29 05/03/2020   HDL 91 05/03/2020   CHOLHDL 2.3 05/03/2020   VLDL 6 05/03/2020   LDLCALC 116 (H) 05/03/2020   Lab Results  Component Value Date   TSH 1.254 05/03/2020    Therapeutic Level Labs: No results found for:  LITHIUM No results found for: VALPROATE No components found for:  CBMZ  Current Medications: Current Outpatient Medications  Medication Sig Dispense Refill  . benzonatate (TESSALON) 100 MG capsule Take 1-2 capsules (100-200 mg total) by mouth 3 (three) times daily as needed for cough. 60 capsule 0  . cetirizine (ZYRTEC ALLERGY) 10 MG tablet Take 1 tablet (10 mg total) by mouth daily. 30 tablet 0  . citalopram (CELEXA) 10 MG tablet Take 1 tablet (10 mg total) by mouth daily. 30 tablet 2  . promethazine-dextromethorphan (PROMETHAZINE-DM) 6.25-15 MG/5ML syrup Take 5 mLs by mouth at bedtime as needed for cough. 100 mL 0  . pseudoephedrine (SUDAFED) 60 MG tablet Take 1 tablet (60 mg total) by mouth every 8 (eight) hours as needed for congestion. 30 tablet 0  . risperiDONE (RISPERDAL) 1 MG tablet Take 1 tablet (1 mg total) by mouth at bedtime. 30 tablet 2  . traZODone (DESYREL) 100 MG tablet Take 1 tablet (100 mg total) by mouth at bedtime. 30 tablet 2   No current facility-administered medications for this visit.     Musculoskeletal: Strength & Muscle Tone: within normal limits Gait & Station: normal Patient leans: N/A  Psychiatric Specialty Exam: Review of Systems    There were no vitals taken for this visit.There is no height or weight on file to calculate BMI.  General Appearance: Fairly Groomed  Eye Contact:  Good  Speech:  Clear and Coherent and Normal Rate  Volume:  Normal  Mood:  Euthymic  Affect:  Congruent  Thought Process:  Goal Directed and Descriptions of Associations: Intact  Orientation:  Full (Time, Place, and Person)  Thought Content: Logical and Denies any hallucinations or delusions   Suicidal Thoughts:  No  Homicidal Thoughts:  No  Memory:  Immediate;   Good Recent;   Good  Judgement:  Fair  Insight:  Fair  Psychomotor Activity:  Normal  Concentration:  Concentration: Good and Attention Span: Good  Recall:  Good  Fund of Knowledge: Good  Language: Good   Akathisia:  Negative  Handed:  Right  AIMS (if indicated): not done  Assets:  Communication Skills Desire for Improvement Financial Resources/Insurance Housing  ADL's:  Intact  Cognition: WNL  Sleep:  Good   Screenings: AUDIT   Flowsheet [facility]  Alcohol Use Disorder Identification Test Final Score (AUDIT) 1    PHQ2-9   Flowsheet Row Counselor from 05/12/2020 in [facility]  PHQ-2 Total Score 2  PHQ-9 Total Score 7    Flowsheet Row Ad[facility] ([facility]  C-SSRS RISK CATEGORY High Risk  Assessment and Plan: Patient reported that her mood has been stable and she is sleeping better.  She denied any other concerns today.  1. Bipolar 1 disorder, mixed, full remission (HCC)  - citalopram (CELEXA) 10 MG tablet; Take 1 tablet (10 mg total) by mouth daily.  Dispense: 30 tablet; Refill: 1 - risperiDONE (RISPERDAL) 1 MG tablet; Take 1 tablet (1 mg total) by mouth at bedtime.  Dispense: 30 tablet; Refill: 1 -traZODone (DESYREL) 100 MG tablet; Take 1 tablet (100 mg total) by mouth at bedtime as needed for sleep.  Dispense: 30 tablet; Refill: 1  Continue same medication regimen. Follow up in 3 months. Continue individual therapy with Ms. Idalia Needle.  Zena Amos, MD 12/28/2020, 10:14 AM

## 2021-01-20 ENCOUNTER — Other Ambulatory Visit (HOSPITAL_COMMUNITY): Payer: Self-pay | Admitting: Psychiatry

## 2021-01-20 DIAGNOSIS — F3178 Bipolar disorder, in full remission, most recent episode mixed: Secondary | ICD-10-CM

## 2021-01-25 ENCOUNTER — Other Ambulatory Visit: Payer: Self-pay

## 2021-01-25 ENCOUNTER — Ambulatory Visit (INDEPENDENT_AMBULATORY_CARE_PROVIDER_SITE_OTHER): Payer: No Payment, Other | Admitting: Clinical

## 2021-01-25 DIAGNOSIS — F3178 Bipolar disorder, in full remission, most recent episode mixed: Secondary | ICD-10-CM | POA: Diagnosis not present

## 2021-01-27 NOTE — Progress Notes (Signed)
   THERAPIST PROGRESS NOTE  Session Time: 45 minutes  Participation Level: Active  Behavioral Response: CasualAlertEuthymic  Type of Therapy: Individual Therapy  Treatment Goals addressed: Diagnosis: depression  Interventions: CBT  Summary:  Brianna Cordova is a 30 y.o. female who presents for the scheduled session oriented times five, appropriately dressed, and friendly. Client denied hallucinations and delusions. Client reported on today she is doing very well. Client reported since the last session she has stopped her previous job with [employer] and now working for [employer]. Client reported she works a few days a week for 10 hour shifts. Client reported she has been able to save enough to buy her grandparents car and afford its expenses on her own. Client reported she is also doing [place] on her off days to have extra pocket money. Client reported she does well managing self care and working. Client reported she has also stopped seeing her boyfriend because he was jealous of her other friendship. Client reported she is happy regardless. Client reported she has been spending time with her mother bonding over self care activities. Client stated, "I've been happy and chipper". Client reported she has been working on keeping a positive mood and improving her self esteem by spending two to five minutes per day to say positive things about herself . Client reported before she had a hard time finding positive things about herself and looking int he mirror.   Flowsheet Row Counselor from 01/25/2021 in [facility]  PHQ-9 Total Score 0      Suicidal/Homicidal: Nowithout intent/plan  Therapist Response:  Therapist began the session checking in and asking the client how she has been doing since last seen. Therapist actively listened to the clients thoughts and feelings. Therapist engaged with the client to discuss changes she has made to keep her motivated. Therapist used  CBT to discuss the connection between thoughts and emotions and learning to separate the two. Therapist encouraged the client to continue practicing positive affirmations to keep her self esteem higher. Client was scheduled for next appointment.    Plan: Return again in 6 weeks for individual therapy.  Diagnosis: Bipolar 1 disorder mixed, in full remission   Neena Rhymes Miguelina Fore, LCSW 01/25/2021

## 2021-02-21 ENCOUNTER — Other Ambulatory Visit: Payer: Self-pay

## 2021-02-21 ENCOUNTER — Ambulatory Visit (INDEPENDENT_AMBULATORY_CARE_PROVIDER_SITE_OTHER): Payer: No Payment, Other | Admitting: Clinical

## 2021-02-21 DIAGNOSIS — F3178 Bipolar disorder, in full remission, most recent episode mixed: Secondary | ICD-10-CM

## 2021-02-21 NOTE — Progress Notes (Signed)
   THERAPIST PROGRESS NOTE  Session Time: 30 minutes  Participation Level: Active  Behavioral Response: CasualAlertEuthymic  Type of Therapy: Individual Therapy  Treatment Goals addressed: Diagnosis: depression  Interventions: CBT  Summary:  Brianna Cordova is a 30 y.o. female who presents for the scheduled session oriented times five, appropriately dressed, and friendly. Client denied hallucinations and delusions. Client reported on today she is doing very well. 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 Client discussed that she previously had thoughts and feelings that her parents were disappointed that she is at her age now and has not gotten as far as she needs to be in her life career wise. Client reported her parents said that was not the case. Client reported she has a goal to finish her degree in technology to have a long time career in [employer]. Client reported she is doing well on her medication. Client reported she has made positive changes about her mindset by talking sooner than later to her friends and family and being optimistic about her future.     Suicidal/Homicidal: Nowithout intent/plan  Therapist Response: Therapist began the session checking in and asking how she has been doing since last seen. Therapist used positive emotional support as client discussed her thoughts and feelings. Therapist used CBT and engaged with the client to reinforce successful replacement of distorted thinking negative thinking with positive reality based cognitive messages.  Therapist assigned the client homework to continue working on improving her daily functioning by having a more positive outlook on resolving conflict and reacting to stressors. Client was  scheduled for next appointment.   Plan: Return again in 4 weeks.  Diagnosis: Bipolar 1 disorder, mixed, full remission  Neena Rhymes Jahnai Slingerland, LCSW 02/21/2021

## 2021-03-16 ENCOUNTER — Ambulatory Visit (HOSPITAL_COMMUNITY): Payer: No Payment, Other | Admitting: Clinical

## 2021-03-27 ENCOUNTER — Ambulatory Visit (HOSPITAL_COMMUNITY): Payer: No Payment, Other | Admitting: Psychiatry

## 2021-04-27 ENCOUNTER — Other Ambulatory Visit: Payer: Self-pay

## 2021-04-27 ENCOUNTER — Ambulatory Visit (INDEPENDENT_AMBULATORY_CARE_PROVIDER_SITE_OTHER): Payer: No Payment, Other | Admitting: Psychiatry

## 2021-04-27 ENCOUNTER — Encounter (HOSPITAL_COMMUNITY): Payer: Self-pay | Admitting: Psychiatry

## 2021-04-27 VITALS — BP 121/75 | HR 72 | Ht 61.0 in | Wt 179.0 lb

## 2021-04-27 DIAGNOSIS — F3178 Bipolar disorder, in full remission, most recent episode mixed: Secondary | ICD-10-CM | POA: Diagnosis not present

## 2021-04-27 MED ORDER — RISPERIDONE 1 MG PO TABS
ORAL_TABLET | ORAL | 2 refills | Status: DC
Start: 2021-04-27 — End: 2021-07-26

## 2021-04-27 MED ORDER — CITALOPRAM HYDROBROMIDE 10 MG PO TABS: 10.0000 mg | ORAL_TABLET | Freq: Every day | ORAL | 2 refills | Status: DC

## 2021-04-27 MED ORDER — TRAZODONE HCL 100 MG PO TABS: 100.0000 mg | ORAL_TABLET | Freq: Every day | ORAL | 2 refills | Status: DC

## 2021-04-27 NOTE — Progress Notes (Signed)
BH MD/PA/NP OP Progress Note  04/27/2021 9:00 AM Brianna Cordova  MRN:  [id number]  Chief Complaint: " I am doing well."  HPI: Patient follows she is doing well.  Her mood has been stable.  She is sleeping well at night.  She informed that she is no longer in the relationship with her boyfriend and it was a sort of a rough break-up.  She said he was sort of controlling and she is glad that the relationship is over now. She informed that she is no longer working at [employer] and is now working at [employer].  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  She stated that she likes her current job because it does not involve a lot of physical labor.  She stated that she works mainly in the [employer]  She denies any side effects to her regimen.  She stated that she has heard about the writer leaving and asked the Clinical research associate regarding that.  All her questions were answered.  She wished the writer the best.  Visit Diagnosis:    ICD-10-CM   1. Bipolar 1 disorder, mixed, full remission (HCC)  F31.78       Past Psychiatric History: Bipolar disorder, used to see Dr. Lolly Mustache in the past.  Has history of several psychiatric hospitalizations in the past.  Past Medical History:  Past Medical History:  Diagnosis Date   Anxiety    Depression    Epilepsy (HCC)    last seizure age 32   Migraine     Past Surgical History:  Procedure Laterality Date   WISDOM TOOTH EXTRACTION  Age 65    Family Psychiatric History: denied  Family History:  Family History  Problem Relation Age of Onset   Diabetes Mother    Hypertension Father     Social History:  Social History   Socioeconomic History   Marital status: Single    Spouse name: Not on file   Number of children: Not on file   Years of education: Not on file   Highest education level: Not on file  Occupational History   Occupation: Consulting civil engineer    Comment: [employer]  Tobacco Use    Smoking status: Never   Smokeless tobacco: Never  Vaping Use   Vaping Use: Never used  Substance and Sexual Activity   Alcohol use: No    Alcohol/week: 0.0 standard drinks    Comment: wine "2 times out of the year"   Drug use: No   Sexual activity: Not Currently    Partners: Male    Birth control/protection: None  Other Topics Concern   Not on file  Social History Narrative   02/18/2013 [facility]  Brianna Cordova was born in [place], [place], and grew up in [place], [place]. She is an only child. She reports that she had a "rough childhood." She did not connect well with people, and had very few friends. She reports that she was teased and tolerated an elementary school. She denies any physical, emotional, or sexual abuse. She is currently a Public librarian at [employer], and she works part-time at [employer]. She is currently living with her mother and father in [place]. She denies any legal problems she affiliates as Personal assistant. She reports that her social support system consists of her mom, dad, on, and therapist. 02/18/2013 A[facility]   Social Determinants of Health   Financial Resource Strain: Not on file  Food Insecurity:  Not on file  Transportation Needs: Not on file  Physical Activity: Not on file  Stress: Not on file  Social Connections: Not on file    Allergies:  Allergies  Allergen Reactions   Black Pepper [Piper] Swelling    Swelling of eyes and become very watery, swelling of tongue    Metabolic Disorder Labs: Lab Results  Component Value Date   HGBA1C 5.1 05/03/2020   MPG 99.67 05/03/2020   No results found for: PROLACTIN Lab Results  Component Value Date   CHOL 213 (H) 05/03/2020   TRIG 29 05/03/2020   HDL 91 05/03/2020   CHOLHDL 2.3 05/03/2020   VLDL 6 05/03/2020   LDLCALC 116 (H) 05/03/2020   Lab Results  Component Value Date   TSH 1.254 05/03/2020    Therapeutic Level Labs: No results found for:  LITHIUM No results found for: VALPROATE No components found for:  CBMZ  Current Medications: Current Outpatient Medications  Medication Sig Dispense Refill   benzonatate (TESSALON) 100 MG capsule Take 1-2 capsules (100-200 mg total) by mouth 3 (three) times daily as needed for cough. 60 capsule 0   cetirizine (ZYRTEC ALLERGY) 10 MG tablet Take 1 tablet (10 mg total) by mouth daily. 30 tablet 0   citalopram (CELEXA) 10 MG tablet Take 1 tablet (10 mg total) by mouth daily. 30 tablet 2   promethazine-dextromethorphan (PROMETHAZINE-DM) 6.25-15 MG/5ML syrup Take 5 mLs by mouth at bedtime as needed for cough. 100 mL 0   pseudoephedrine (SUDAFED) 60 MG tablet Take 1 tablet (60 mg total) by mouth every 8 (eight) hours as needed for congestion. 30 tablet 0   risperiDONE (RISPERDAL) 1 MG tablet TAKE 1 TABLET(1 MG) BY MOUTH AT BEDTIME 30 tablet 2   traZODone (DESYREL) 100 MG tablet Take 1 tablet (100 mg total) by mouth at bedtime. 30 tablet 2   No current facility-administered medications for this visit.     Musculoskeletal: Strength & Muscle Tone: within normal limits Gait & Station: normal Patient leans: N/A  Psychiatric Specialty Exam: Review of Systems    There were no vitals taken for this visit.There is no height or weight on file to calculate BMI.  General Appearance: Well Groomed  Eye Contact:  Good  Speech:  Clear and Coherent and Normal Rate  Volume:  Normal  Mood:  Euthymic  Affect:  Congruent  Thought Process:  Goal Directed and Descriptions of Associations: Intact  Orientation:  Full (Time, Place, and Person)  Thought Content: Logical and Denies any hallucinations or delusions    Suicidal Thoughts:  No  Homicidal Thoughts:  No  Memory:  Immediate;   Good Recent;   Good  Judgement:  Fair  Insight:  Fair  Psychomotor Activity:  Normal  Concentration:  Concentration: Good and Attention Span: Good  Recall:  Good  Fund of Knowledge: Good  Language: Good  Akathisia:   Negative  Handed:  Right  AIMS (if indicated): not done  Assets:  Communication Skills Desire for Improvement Financial Resources/Insurance Housing  ADL's:  Intact  Cognition: WNL  Sleep:  Good   Screenings: AUDIT    Flowsheet Row [facility]  Alcohol Use Disorder Identification Test Final Score (AUDIT) 1      PHQ2-9    Flowsheet Row Counselor from 01/25/2021 in [facility] Counselor from 05/12/2020 in [facility]  PHQ-2 Total Score 0 2  PHQ-9 Total Score 0 7  Flowsheet Row Counselor from 01/25/2021 in [facility] Admission (Discharged) [facility]  C-SSRS RISK CATEGORY No Risk High Risk        Assessment and Plan: Patient appears to be doing well.   1. Bipolar 1 disorder, mixed, full remission (HCC)  - citalopram (CELEXA) 10 MG tablet; Take 1 tablet (10 mg total) by mouth daily.  Dispense: 30 tablet; Refill: 2 - risperiDONE (RISPERDAL) 1 MG tablet; Take 1 tablet (1 mg total) by mouth at bedtime.  Dispense: 30 tablet; Refill: 2 -traZODone (DESYREL) 100 MG tablet; Take 1 tablet (100 mg total) by mouth at bedtime as needed for sleep.  Dispense: 30 tablet; Refill: 2  Continue same medication regimen. Follow up in 3 months. Continue individual therapy with Ms. Idalia Needle.  Patient was informed that her care is being transferred to a different provider in the clinic due to the writer leaving the office.  Zena Amos, MD 04/27/2021, 9:00 AM

## 2021-06-12 ENCOUNTER — Ambulatory Visit (HOSPITAL_COMMUNITY): Payer: No Payment, Other | Admitting: Clinical

## 2021-07-26 ENCOUNTER — Other Ambulatory Visit: Payer: Self-pay

## 2021-07-26 ENCOUNTER — Encounter (HOSPITAL_COMMUNITY): Payer: Self-pay | Admitting: Physician Assistant

## 2021-07-26 ENCOUNTER — Ambulatory Visit (INDEPENDENT_AMBULATORY_CARE_PROVIDER_SITE_OTHER): Payer: No Payment, Other | Admitting: Physician Assistant

## 2021-07-26 DIAGNOSIS — F3178 Bipolar disorder, in full remission, most recent episode mixed: Secondary | ICD-10-CM

## 2021-07-26 MED ORDER — TRAZODONE HCL 100 MG PO TABS: 100.0000 mg | ORAL_TABLET | Freq: Every day | ORAL | 2 refills | Status: DC

## 2021-07-26 MED ORDER — CITALOPRAM HYDROBROMIDE 10 MG PO TABS
10.0000 mg | ORAL_TABLET | Freq: Every day | ORAL | 2 refills | Status: DC
Start: 2021-07-26 — End: 2021-09-21

## 2021-07-26 MED ORDER — RISPERIDONE 1 MG PO TABS: ORAL_TABLET | ORAL | 2 refills | Status: DC

## 2021-07-26 NOTE — Progress Notes (Signed)
BH MD/PA/NP OP Progress Note  07/26/2021 5:13 PM RONI FRIBERG  MRN:  [id number]  Chief Complaint:  Chief Complaint   Medication Management    HPI:   Liberti Appleton. Pilling is a 30 year old female with a past psychiatric history significant for bipolar disorder who presents to [facility] for follow-up and medication management.  Patient is currently being managed on the following medications:  Citalopram 10 mg daily Risperidone 1 mg at bedtime Trazodone 100 mg at bedtime  Patient reports that she was diagnosed with bipolar disorder back in 2013.  Patient states that when her bipolar disorder was active, she experienced really bad anxiety and depressive episodes that lasted for weeks.  Whenever her bipolar disorder was at its worst, patient stated that she would experience hearing voices.  Patient expressed that her manic episodes were not as common as her depressive episodes.  During her manic episodes, patient would spend a lot of money, experienced elevated mood, and be more hyperactive.  Patient reports no issues or concerns regarding her current medication regimen.  Patient denies the need for dosage adjustments at this time and is requesting refills on all her medications following the conclusion of the encounter.  Patient rates her anxiety a 2 out of 10.  Patient's stressors include financial instability looking for a new job, and some dependent family members.  A PHQ-9 screen was performed with the patient scoring a 7.  A GAD-7 screen was also performed with the patient scoring a 4.  Patient is alert and oriented x4, pleasant, calm, cooperative, and fully engaged in conversation during the encounter.  Patient endorses good mood.  Patient denies suicidal or homicidal ideations.  She further denies auditory or visual hallucinations and does not appear to be responding to internal/external stimuli.  Patient endorses good sleep and receives on average  7 hours of sleep each night.  Patient endorses good appetite and eats on average 2 meals per day.  Patient denies alcohol consumption, tobacco use, and illicit drug use.  Visit Diagnosis:    ICD-10-CM   1. Bipolar 1 disorder, mixed, full remission (HCC)  F31.78 risperiDONE (RISPERDAL) 1 MG tablet    citalopram (CELEXA) 10 MG tablet    traZODone (DESYREL) 100 MG tablet      Past Psychiatric History:  Bipolar disorder, used to see Dr. Lolly Mustache in the past.  Has history of several psychiatric hospitalizations in the past  Past Medical History:  Past Medical History:  Diagnosis Date   Anxiety    Depression    Epilepsy (HCC)    last seizure age 26   Migraine     Past Surgical History:  Procedure Laterality Date   WISDOM TOOTH EXTRACTION  Age 69    Family Psychiatric History:  Denied  Family History:  Family History  Problem Relation Age of Onset   Diabetes Mother    Hypertension Father     Social History:  Social History   Socioeconomic History   Marital status: Single    Spouse name: Not on file   Number of children: Not on file   Years of education: Not on file   Highest education level: Not on file  Occupational History   Occupation: Student    Comment: [employer]  Tobacco Use   Smoking status: Never   Smokeless tobacco: Never  Vaping Use   Vaping Use: Never used  Substance and Sexual Activity   Alcohol use: No    Alcohol/week: 0.0 standard drinks  Comment: wine "2 times out of the year"   Drug use: No   Sexual activity: Not Currently    Partners: Male    Birth control/protection: None  Other Topics Concern   Not on file  Social History Narrative   02/18/2013 AHW  Korrina was born in [place], [place], and grew up in [place], [place]. She is an only child. She reports that she had a "rough childhood." She did not connect well with people, and had very few friends. She reports that she was teased and tolerated an elementary school. She denies any  physical, emotional, or sexual abuse. She is currently a Public librarian at [employer], and she works part-time at [employer]. She is currently living with her mother and father in [place]. She denies any legal problems she affiliates as Personal assistant. She reports that her social support system consists of her mom, dad, on, and therapist. 02/18/2013 AHW   Social Determinants of Health   Financial Resource Strain: Not on file  Food Insecurity: Not on file  Transportation Needs: Not on file  Physical Activity: Not on file  Stress: Not on file  Social Connections: Not on file    Allergies:  Allergies  Allergen Reactions   Black Pepper [Piper] Swelling    Swelling of eyes and become very watery, swelling of tongue    Metabolic Disorder Labs: Lab Results  Component Value Date   HGBA1C 5.1 05/03/2020   MPG 99.67 05/03/2020   No results found for: PROLACTIN Lab Results  Component Value Date   CHOL 213 (H) 05/03/2020   TRIG 29 05/03/2020   HDL 91 05/03/2020   CHOLHDL 2.3 05/03/2020   VLDL 6 05/03/2020   LDLCALC 116 (H) 05/03/2020   Lab Results  Component Value Date   TSH 1.254 05/03/2020    Therapeutic Level Labs: No results found for: LITHIUM No results found for: VALPROATE No components found for:  CBMZ  Current Medications: Current Outpatient Medications  Medication Sig Dispense Refill   benzonatate (TESSALON) 100 MG capsule Take 1-2 capsules (100-200 mg total) by mouth 3 (three) times daily as needed for cough. 60 capsule 0   cetirizine (ZYRTEC ALLERGY) 10 MG tablet Take 1 tablet (10 mg total) by mouth daily. 30 tablet 0   promethazine-dextromethorphan (PROMETHAZINE-DM) 6.25-15 MG/5ML syrup Take 5 mLs by mouth at bedtime as needed for cough. 100 mL 0   pseudoephedrine (SUDAFED) 60 MG tablet Take 1 tablet (60 mg total) by mouth every 8 (eight) hours as needed for congestion. 30 tablet 0   citalopram (CELEXA) 10 MG tablet  Take 1 tablet (10 mg total) by mouth daily. 30 tablet 2   risperiDONE (RISPERDAL) 1 MG tablet TAKE 1 TABLET(1 MG) BY MOUTH AT BEDTIME 30 tablet 2   traZODone (DESYREL) 100 MG tablet Take 1 tablet (100 mg total) by mouth at bedtime. 30 tablet 2   No current facility-administered medications for this visit.     Musculoskeletal: Strength & Muscle Tone: within normal limits Gait & Station: normal Patient leans: N/A  Psychiatric Specialty Exam: Review of Systems  Psychiatric/Behavioral:  Negative for decreased concentration, dysphoric mood, hallucinations, self-injury, sleep disturbance and suicidal ideas. The patient is not nervous/anxious and is not hyperactive.    Blood pressure 120/74, pulse 88, height 5\' 1"  (1.549 m), weight 173 lb (78.5 kg).Body mass index is 32.69 kg/m.  General Appearance: Well Groomed  Eye Contact:  Good  Speech:  Clear and Coherent and Normal Rate  Volume:  Normal  Mood:  Euthymic  Affect:  Appropriate  Thought Process:  Coherent, Goal Directed, and Descriptions of Associations: Intact  Orientation:  Full (Time, Place, and Person)  Thought Content: WDL   Suicidal Thoughts:  No  Homicidal Thoughts:  No  Memory:  Immediate;   Good Recent;   Good Remote;   Good  Judgement:  Good  Insight:  Good  Psychomotor Activity:  Normal  Concentration:  Concentration: Good and Attention Span: Good  Recall:  Good  Fund of Knowledge: Good  Language: Good  Akathisia:  No  Handed:  Right  AIMS (if indicated): not done  Assets:  Communication Skills Desire for Improvement Financial Resources/Insurance Housing  ADL's:  Intact  Cognition: WNL  Sleep:  Good   Screenings: AUDIT    Flowsheet Row [facility]  Alcohol Use Disorder Identification Test Final Score (AUDIT) 1      GAD-7    Flowsheet Row [facility]  Total GAD-7  Score 4      PHQ2-9    Flowsheet Row Clinical Support from 07/26/2021 in [facility] Counselor from 01/25/2021 in [facility] Counselor from 05/12/2020 in [facility]  PHQ-2 Total Score 2 0 2  PHQ-9 Total Score 7 0 7      Flowsheet Row Clinical Support from 07/26/2021 in [facility] Counselor from 01/25/2021 in [facility] Admission (Discharged) [facility]  C-SSRS RISK CATEGORY Low Risk No Risk High Risk        Assessment and Plan:   Jaleya Pebley. Shadix is a 30 year old female with a past psychiatric history significant for bipolar disorder who presents to [facility] for follow-up and medication management.  Despite some anxiety and stressors, patient reports no issues or concerns regarding her current medication regimen.  Patient denies the need for dosage adjustments at this time and is requesting refills on all her medications following the conclusion of the encounter.  Patient's medications to be e-prescribed to pharmacy of choice.  1. Bipolar 1 disorder, mixed, full remission (HCC)  - risperiDONE (RISPERDAL) 1 MG tablet; TAKE 1 TABLET(1 MG) BY MOUTH AT BEDTIME  Dispense: 30 tablet; Refill: 2 - citalopram (CELEXA) 10 MG tablet; Take 1 tablet (10 mg total) by mouth daily.  Dispense: 30 tablet; Refill: 2 - traZODone (DESYREL) 100 MG tablet; Take 1 tablet (100 mg total) by mouth at bedtime.  Dispense: 30 tablet; Refill: 2  Patient to follow up in 2 months Provider spent a total of 25 minutes with the patient/reviewing patient's chart  Meta Hatchet, PA 07/26/2021, 5:13 PM

## 2021-09-21 ENCOUNTER — Ambulatory Visit (INDEPENDENT_AMBULATORY_CARE_PROVIDER_SITE_OTHER): Payer: No Payment, Other | Admitting: Physician Assistant

## 2021-09-21 ENCOUNTER — Other Ambulatory Visit: Payer: Self-pay

## 2021-09-21 ENCOUNTER — Encounter (HOSPITAL_COMMUNITY): Payer: Self-pay | Admitting: Physician Assistant

## 2021-09-21 DIAGNOSIS — F3178 Bipolar disorder, in full remission, most recent episode mixed: Secondary | ICD-10-CM

## 2021-09-21 MED ORDER — RISPERIDONE 1 MG PO TABS
ORAL_TABLET | ORAL | 2 refills | Status: AC
  Filled 2021-09-21: qty 30, 30d supply, fill #0

## 2021-09-21 MED ORDER — TRAZODONE HCL 100 MG PO TABS
100.0000 mg | ORAL_TABLET | Freq: Every day | ORAL | 2 refills | Status: AC
  Filled 2021-09-21: qty 30, 30d supply, fill #0

## 2021-09-21 MED ORDER — CITALOPRAM HYDROBROMIDE 10 MG PO TABS
10.0000 mg | ORAL_TABLET | Freq: Every day | ORAL | 2 refills | Status: AC
  Filled 2021-09-21: qty 30, 30d supply, fill #0

## 2021-09-21 NOTE — Progress Notes (Signed)
BH MD/PA/NP OP Progress Note  09/21/2021 4:12 PM Brianna Cordova  MRN:  [id number]  Chief Complaint:  Chief Complaint   Medication Management    HPI:   Brianna Cordova is a 30 year old female with a past psychiatric history significant for bipolar disorder who presents to [facility] for follow-up and medication management.  Patient is currently being managed on the following medications:  Citalopram 10 mg daily Trazodone 100 mg at bedtime Risperdal 1 mg at bedtime  Patient reports no issues or concerns regarding her current medication regimen.  Patient denies the need for dosage adjustments at this time and is requesting refills on all her medications following the conclusion of the encounter.  Patient denies depressive symptoms stating that she has been able to balance her schedule of working 2 jobs without too much stress.  Patient denies anxiety.  Patient's only stressor involves dissatisfaction with being unable to go on vacation due to her 2 jobs.  GAD-7 screen was performed with the patient scoring a 6.  Patient is alert and oriented x4, calm, cooperative, and fully engaged in conversation during the encounter.  Patient endorses good mood.  Patient denies suicidal or homicidal ideations.  She further denies auditory or visual hallucinations and does not appear to be responding to internal/external stimuli.  Patient endorses fair sleep and receives on average 5 to 6 hours of sleep each night.  Patient endorses fair appetite and eats on average 1-2 meals per day.  Patient denies alcohol consumption, tobacco use, and illicit drug use.  Visit Diagnosis:    ICD-10-CM   1. Bipolar 1 disorder, mixed, full remission (HCC)  F31.78 citalopram (CELEXA) 10 MG tablet    traZODone (DESYREL) 100 MG tablet    risperiDONE (RISPERDAL) 1 MG tablet      Past Psychiatric History:  Bipolar disorder, used to see Dr. Lolly Mustache in the past.  Has history of  several psychiatric hospitalizations in the past  Past Medical History:  Past Medical History:  Diagnosis Date   Anxiety    Depression    Epilepsy (HCC)    last seizure age 53   Migraine     Past Surgical History:  Procedure Laterality Date   WISDOM TOOTH EXTRACTION  Age 39    Family Psychiatric History:  Denied  Family History:  Family History  Problem Relation Age of Onset   Diabetes Mother    Hypertension Father     Social History:  Social History   Socioeconomic History   Marital status: Single    Spouse name: Not on file   Number of children: Not on file   Years of education: Not on file   Highest education level: Not on file  Occupational History   Occupation: Consulting civil engineer    Comment: [employer]  Tobacco Use   Smoking status: Never   Smokeless tobacco: Never  Vaping Use   Vaping Use: Never used  Substance and Sexual Activity   Alcohol use: No    Alcohol/week: 0.0 standard drinks    Comment: wine "2 times out of the year"   Drug use: No   Sexual activity: Not Currently    Partners: Male    Birth control/protection: None  Other Topics Concern   Not on file  Social History Narrative   02/18/2013 AHW  Brianna Cordova was born in [place], [place], and grew up in [place], [place]. She is an only child. She reports that she had a "rough childhood." She did not  connect well with people, and had very few friends. She reports that she was teased and tolerated an elementary school. She denies any physical, emotional, or sexual abuse. She is currently a Public librarian at [employer], and she works part-time at [employer]. She is currently living with her mother and father in [place]. She denies any legal problems she affiliates as Personal assistant. She reports that her social support system consists of her mom, dad, on, and therapist. 02/18/2013 AHW   Social Determinants of Health   Financial Resource Strain: Not on file   Food Insecurity: Not on file  Transportation Needs: Not on file  Physical Activity: Not on file  Stress: Not on file  Social Connections: Not on file    Allergies:  Allergies  Allergen Reactions   Black Pepper [Piper] Swelling    Swelling of eyes and become very watery, swelling of tongue    Metabolic Disorder Labs: Lab Results  Component Value Date   HGBA1C 5.1 05/03/2020   MPG 99.67 05/03/2020   No results found for: PROLACTIN Lab Results  Component Value Date   CHOL 213 (H) 05/03/2020   TRIG 29 05/03/2020   HDL 91 05/03/2020   CHOLHDL 2.3 05/03/2020   VLDL 6 05/03/2020   LDLCALC 116 (H) 05/03/2020   Lab Results  Component Value Date   TSH 1.254 05/03/2020    Therapeutic Level Labs: No results found for: LITHIUM No results found for: VALPROATE No components found for:  CBMZ  Current Medications: Current Outpatient Medications  Medication Sig Dispense Refill   benzonatate (TESSALON) 100 MG capsule Take 1-2 capsules (100-200 mg total) by mouth 3 (three) times daily as needed for cough. 60 capsule 0   cetirizine (ZYRTEC ALLERGY) 10 MG tablet Take 1 tablet (10 mg total) by mouth daily. 30 tablet 0   promethazine-dextromethorphan (PROMETHAZINE-DM) 6.25-15 MG/5ML syrup Take 5 mLs by mouth at bedtime as needed for cough. 100 mL 0   pseudoephedrine (SUDAFED) 60 MG tablet Take 1 tablet (60 mg total) by mouth every 8 (eight) hours as needed for congestion. 30 tablet 0   citalopram (CELEXA) 10 MG tablet Take 1 tablet (10 mg total) by mouth daily. 30 tablet 2   risperiDONE (RISPERDAL) 1 MG tablet TAKE 1 TABLET(1 MG) BY MOUTH AT BEDTIME 30 tablet 2   traZODone (DESYREL) 100 MG tablet Take 1 tablet (100 mg total) by mouth at bedtime. 30 tablet 2   No current facility-administered medications for this visit.     Musculoskeletal: Strength & Muscle Tone: within normal limits Gait & Station: normal Patient leans: N/A  Psychiatric Specialty Exam: Review of Systems   Psychiatric/Behavioral:  Positive for sleep disturbance. Negative for decreased concentration, dysphoric mood, hallucinations, self-injury and suicidal ideas. The patient is not nervous/anxious and is not hyperactive.    Blood pressure 133/61, pulse 63, height 5\' 1"  (1.549 m), weight 161 lb (73 kg).Body mass index is 30.42 kg/m.  General Appearance: Well Groomed  Eye Contact:  Good  Speech:  Clear and Coherent and Normal Rate  Volume:  Normal  Mood:  Euthymic  Affect:  Appropriate  Thought Process:  Coherent and Descriptions of Associations: Intact  Orientation:  Full (Time, Place, and Person)  Thought Content: WDL   Suicidal Thoughts:  No  Homicidal Thoughts:  No  Memory:  Immediate;   Good Recent;   Good Remote;   Good  Judgement:  Good  Insight:  Good  Psychomotor Activity:  Normal  Concentration:  Concentration: Good and Attention Span: Good  Recall:  Good  Fund of Knowledge: Good  Language: Good  Akathisia:  No  Handed:  Right  AIMS (if indicated): not done  Assets:  Communication Skills Desire for Improvement Financial Resources/Insurance Housing Vocational/Educational  ADL's:  Intact  Cognition: WNL  Sleep:  Fair   Screenings: AUDIT    Flowsheet Row [facility]  Alcohol Use Disorder Identification Test Final Score (AUDIT) 1      GAD-7    Flowsheet Row Office Visit from 09/21/2021 in [facility] Office Visit from 07/26/2021 in [facility]  Total GAD-7 Score 6 4      PHQ2-9    Flowsheet Row Office Visit from 09/21/2021 in [facility] Office Visit from 07/26/2021 in [facility] Counselor from 01/25/2021 in [facility] Counselor from 05/12/2020 in [facility]  PHQ-2 Total Score 0 2 0 2  PHQ-9 Total Score -- 7 0 7       Flowsheet Row Office Visit from 09/21/2021 in [facility] Office Visit from 07/26/2021 in [facility] Counselor from 01/25/2021 in [facility]  C-SSRS RISK CATEGORY Low Risk Low Risk No Risk        Assessment and Plan:   Brianna Cordova is a 30 year old female with a past psychiatric history significant for bipolar disorder who presents to [facility] for follow-up and medication management.  Patient reports no issues or concerns regarding her current medication regimen.  Patient denies the need for dosage adjustments at this time and is requesting refills on all her medications following the conclusion of the encounter.  Patient's medications to be e-prescribed to pharmacy of choice.  1. Bipolar 1 disorder, mixed, full remission (HCC)  - citalopram (CELEXA) 10 MG tablet; Take 1 tablet (10 mg total) by mouth daily.  Dispense: 30 tablet; Refill: 2 - traZODone (DESYREL) 100 MG tablet; Take 1 tablet (100 mg total) by mouth at bedtime.  Dispense: 30 tablet; Refill: 2 - risperiDONE (RISPERDAL) 1 MG tablet; TAKE 1 TABLET(1 MG) BY MOUTH AT BEDTIME  Dispense: 30 tablet; Refill: 2  Patient to follow up in 2 months Provider spent a total of 21 minutes with the patient/reviewing patient's chart  Meta Hatchet, PA 09/21/2021, 4:12 PM

## 2021-09-22 ENCOUNTER — Encounter (HOSPITAL_COMMUNITY): Payer: Self-pay | Admitting: Physician Assistant

## 2021-09-22 ENCOUNTER — Other Ambulatory Visit: Payer: Self-pay

## 2021-09-29 ENCOUNTER — Other Ambulatory Visit: Payer: Self-pay

## 2021-11-23 ENCOUNTER — Encounter (HOSPITAL_COMMUNITY): Payer: Self-pay

## 2021-11-23 ENCOUNTER — Telehealth (HOSPITAL_COMMUNITY): Payer: No Payment, Other | Admitting: Physician Assistant

## 2022-06-21 ENCOUNTER — Other Ambulatory Visit: Payer: Self-pay | Admitting: Nurse Practitioner

## 2022-06-21 DIAGNOSIS — N643 Galactorrhea not associated with childbirth: Secondary | ICD-10-CM

## 2022-07-06 ENCOUNTER — Ambulatory Visit
Admission: RE | Admit: 2022-07-06 | Discharge: 2022-07-06 | Disposition: A | Discharge status: Home / Self Care | Payer: Managed Care, Other (non HMO) | Source: Ambulatory Visit | Attending: Nurse Practitioner | Admitting: Nurse Practitioner

## 2022-07-06 ENCOUNTER — Other Ambulatory Visit: Payer: Self-pay | Admitting: Nurse Practitioner

## 2022-07-06 ENCOUNTER — Ambulatory Visit: Payer: Self-pay

## 2022-07-06 ENCOUNTER — Ambulatory Visit: Payer: Managed Care, Other (non HMO)

## 2022-07-06 DIAGNOSIS — N643 Galactorrhea not associated with childbirth: Secondary | ICD-10-CM

## 2022-07-06 DIAGNOSIS — R921 Mammographic calcification found on diagnostic imaging of breast: Secondary | ICD-10-CM

## 2022-11-19 ENCOUNTER — Telehealth (HOSPITAL_COMMUNITY): Payer: Self-pay | Admitting: Physician Assistant

## 2023-01-02 ENCOUNTER — Ambulatory Visit (HOSPITAL_COMMUNITY): Payer: Self-pay | Admitting: Mental Health

## 2023-01-07 ENCOUNTER — Ambulatory Visit
Admission: RE | Admit: 2023-01-07 | Discharge: 2023-01-07 | Disposition: A | Discharge status: Home / Self Care | Payer: Managed Care, Other (non HMO) | Source: Ambulatory Visit | Attending: Nurse Practitioner | Admitting: Nurse Practitioner

## 2023-01-07 DIAGNOSIS — R921 Mammographic calcification found on diagnostic imaging of breast: Secondary | ICD-10-CM

## 2023-09-07 ENCOUNTER — Other Ambulatory Visit: Payer: Self-pay

## 2023-09-07 ENCOUNTER — Emergency Department (HOSPITAL_BASED_OUTPATIENT_CLINIC_OR_DEPARTMENT_OTHER)
Admission: EM | Admit: 2023-09-07 | Discharge: 2023-09-07 | Disposition: A | Discharge status: Home / Self Care | Payer: Managed Care, Other (non HMO) | Attending: Emergency Medicine | Admitting: Emergency Medicine

## 2023-09-07 ENCOUNTER — Encounter (HOSPITAL_BASED_OUTPATIENT_CLINIC_OR_DEPARTMENT_OTHER): Payer: Self-pay

## 2023-09-07 DIAGNOSIS — W228XXA Striking against or struck by other objects, initial encounter: Secondary | ICD-10-CM | POA: Diagnosis not present

## 2023-09-07 DIAGNOSIS — Z23 Encounter for immunization: Secondary | ICD-10-CM | POA: Insufficient documentation

## 2023-09-07 DIAGNOSIS — S0181XA Laceration without foreign body of other part of head, initial encounter: Secondary | ICD-10-CM | POA: Insufficient documentation

## 2023-09-07 DIAGNOSIS — Y990 Civilian activity done for income or pay: Secondary | ICD-10-CM | POA: Insufficient documentation

## 2023-09-07 DIAGNOSIS — S060X0A Concussion without loss of consciousness, initial encounter: Secondary | ICD-10-CM | POA: Insufficient documentation

## 2023-09-07 DIAGNOSIS — S0990XA Unspecified injury of head, initial encounter: Secondary | ICD-10-CM

## 2023-09-07 MED ORDER — TETANUS-DIPHTH-ACELL PERTUSSIS 5-2.5-18.5 LF-MCG/0.5 IM SUSY
0.5000 mL | PREFILLED_SYRINGE | Freq: Once | INTRAMUSCULAR | Status: AC
  Administered 2023-09-07: 0.5 mL via INTRAMUSCULAR
  Filled 2023-09-07: qty 0.5

## 2023-09-07 MED ORDER — LIDOCAINE-EPINEPHRINE-TETRACAINE (LET) TOPICAL GEL
3.0000 mL | Freq: Once | TOPICAL | Status: AC
  Administered 2023-09-07: 3 mL via TOPICAL
  Filled 2023-09-07: qty 3

## 2023-09-07 MED ORDER — ACETAMINOPHEN 500 MG PO TABS
1000.0000 mg | ORAL_TABLET | Freq: Once | ORAL | Status: AC
  Administered 2023-09-07: 1000 mg via ORAL
  Filled 2023-09-07: qty 2

## 2023-09-07 NOTE — ED Notes (Signed)
Pt. Was able to ambulate without any distress and with steady gait.

## 2023-09-07 NOTE — ED Provider Notes (Signed)
[place] [facility] Provider Note   CSN: [id number] Arrival date & time: 09/07/23  1930     History {Add pertinent medical, surgical, social history, OB history to HPI:1} Chief Complaint  Patient presents with   Head Laceration         Brianna Cordova is a 32 y.o. female with PMHx migraine, epilepsy, anxiety who presents to ED concerned for head trauma and head laceration. Was at work when she stood up and hit head on shelf. Patient felt fine until she noticed blood dripping down her face. She then started having unsteady gait so her boss called EMS.   Denies LOC, seizures, blood thinners, vision changes.    Head Laceration       Home Medications Prior to Admission medications   Medication Sig Start Date End Date Taking? Authorizing Provider  benzonatate (TESSALON) 100 MG capsule Take 1-2 capsules (100-200 mg total) by mouth 3 (three) times daily as needed for cough. 11/10/20   Wallis Bamberg, PA-C  cetirizine (ZYRTEC ALLERGY) 10 MG tablet Take 1 tablet (10 mg total) by mouth daily. 11/10/20   Wallis Bamberg, PA-C  citalopram (CELEXA) 10 MG tablet Take 1 tablet (10 mg total) by mouth daily. 09/21/21   Nwoko, Tommas Olp, PA  promethazine-dextromethorphan (PROMETHAZINE-DM) 6.25-15 MG/5ML syrup Take 5 mLs by mouth at bedtime as needed for cough. 11/10/20   Wallis Bamberg, PA-C  pseudoephedrine (SUDAFED) 60 MG tablet Take 1 tablet (60 mg total) by mouth every 8 (eight) hours as needed for congestion. 11/10/20   Wallis Bamberg, PA-C  risperiDONE (RISPERDAL) 1 MG tablet TAKE 1 TABLET(1 MG) BY MOUTH AT BEDTIME 09/21/21   Nwoko, Stephens Shire E, PA  traZODone (DESYREL) 100 MG tablet Take 1 tablet (100 mg total) by mouth at bedtime. 09/21/21   Nwoko, Tommas Olp, PA      Allergies    Black pepper [piper]    Review of Systems   Review of Systems  Physical Exam Updated Vital Signs BP 121/82 (BP Location: Right Arm)   Pulse 63   Temp 99.3 F (37.4 C) (Oral)    Resp 18   Ht 5\' 1"  (1.549 m)   Wt 71.7 kg   LMP 08/20/2023 (Approximate)   SpO2 100%   BMI 29.85 kg/m  Physical Exam Vitals and nursing note reviewed.  Constitutional:      General: She is not in acute distress. HENT:     Head: Normocephalic and atraumatic.     Comments: <1cm laceration on crown of head. No active bleeding. No swelling, erythema, or purulence.     Mouth/Throat:     Mouth: Mucous membranes are moist.  Eyes:     General: No scleral icterus.       Right eye: No discharge.        Left eye: No discharge.     Extraocular Movements: Extraocular movements intact.     Conjunctiva/sclera: Conjunctivae normal.     Pupils: Pupils are equal, round, and reactive to light.  Cardiovascular:     Rate and Rhythm: Normal rate and regular rhythm.     Pulses: Normal pulses.     Heart sounds: Normal heart sounds. No murmur heard. Pulmonary:     Effort: Pulmonary effort is normal.  Abdominal:     General: Abdomen is flat.  Musculoskeletal:     Right lower leg: No edema.     Left lower leg: No edema.  Skin:    General: Skin is warm  and dry.     Findings: No rash.  Neurological:     General: No focal deficit present.     Mental Status: She is alert. Mental status is at baseline.     Comments: GCS 15. Speech is goal oriented. No deficits appreciated to CN III-XII; symmetric eyebrow raise, no facial drooping, tongue midline. Patient has equal grip strength bilaterally with 5/5 strength against resistance in all major muscle groups bilaterally. Sensation to light touch intact. Patient moves extremities without ataxia. Patient ambulatory with steady gait.   Psychiatric:        Mood and Affect: Mood normal.        Behavior: Behavior normal.     ED Results / Procedures / Treatments   Labs (all labs ordered are listed, but only abnormal results are displayed) Labs Reviewed - No data to display  EKG None  Radiology No results found.  Procedures Procedures  {Document  cardiac monitor, telemetry assessment procedure when appropriate:1}  Medications Ordered in ED Medications  lidocaine-EPINEPHrine-tetracaine (LET) topical gel (3 mLs Topical Given 09/07/23 2043)  acetaminophen (TYLENOL) tablet 1,000 mg (1,000 mg Oral Given 09/07/23 2147)    ED Course/ Medical Decision Making/ A&P   {   Click here for ABCD2, HEART and other calculatorsREFRESH Note before signing :1}                              Medical Decision Making Risk OTC drugs.   *** Patient initially with slow gait that resolved after tylenol.  {Document critical care time when appropriate:1} {Document review of labs and clinical decision tools ie heart score, Chads2Vasc2 etc:1}  {Document your independent review of radiology images, and any outside records:1} {Document your discussion with family members, caretakers, and with consultants:1} {Document social determinants of health affecting pt's care:1} {Document your decision making why or why not admission, treatments were needed:1} Final Clinical Impression(s) / ED Diagnoses Final diagnoses:  Injury of head, initial encounter  Concussion without loss of consciousness, initial encounter    Rx / DC Orders ED Discharge Orders     None

## 2023-09-07 NOTE — Discharge Instructions (Addendum)
I am glad you are feeling better.  Please follow-up with your primary care provider in the next couple days.  Please also see educational handout about concussions.  Seek emergency care if experiencing any new or worsening symptoms.  Alternating between 650 mg Tylenol and 400 mg Advil: The best way to alternate taking Acetaminophen (example Tylenol) and Ibuprofen (example Advil/Motrin) is to take them 3 hours apart. For example, if you take ibuprofen at 6 am you can then take Tylenol at 9 am. You can continue this regimen throughout the day, making sure you do not exceed the recommended maximum dose for each drug.

## 2023-09-07 NOTE — ED Triage Notes (Signed)
Pt BIB EMS coming from work. Pt stood up and hit her head on a rack and sustained a lac to the head.   EMS Vitals  130/76 HR 74 SpO2 98
